# Patient Record
Sex: Male | Born: 1985 | Race: Black or African American | Hispanic: No | Marital: Single | Smoking: Former smoker
Health system: Southern US, Community
[De-identification: ages and names within clinical notes are randomized; demographics above are authoritative.]

---
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
## 2007-02-28 ENCOUNTER — Emergency Department (HOSPITAL_COMMUNITY): Admission: EM | Admit: 2007-02-28 | Discharge: 2007-02-28 | Payer: Self-pay | Admitting: *Deleted

## 2007-03-01 ENCOUNTER — Emergency Department (HOSPITAL_COMMUNITY): Admission: EM | Admit: 2007-03-01 | Discharge: 2007-03-01 | Payer: Self-pay | Admitting: Emergency Medicine

## 2007-03-04 ENCOUNTER — Telehealth: Payer: Self-pay | Admitting: *Deleted

## 2007-09-28 ENCOUNTER — Emergency Department (HOSPITAL_COMMUNITY): Admission: EM | Admit: 2007-09-28 | Discharge: 2007-09-29 | Payer: Self-pay | Admitting: Emergency Medicine

## 2007-12-27 ENCOUNTER — Ambulatory Visit: Payer: Self-pay | Admitting: Family Medicine

## 2009-02-18 ENCOUNTER — Emergency Department (HOSPITAL_COMMUNITY): Admission: EM | Admit: 2009-02-18 | Discharge: 2009-02-18 | Payer: Self-pay | Admitting: Emergency Medicine

## 2009-02-25 ENCOUNTER — Emergency Department (HOSPITAL_COMMUNITY): Admission: EM | Admit: 2009-02-25 | Discharge: 2009-02-25 | Payer: Self-pay | Admitting: Emergency Medicine

## 2009-03-11 ENCOUNTER — Encounter (INDEPENDENT_AMBULATORY_CARE_PROVIDER_SITE_OTHER): Payer: Self-pay | Admitting: *Deleted

## 2009-03-11 DIAGNOSIS — F17200 Nicotine dependence, unspecified, uncomplicated: Secondary | ICD-10-CM

## 2009-04-23 ENCOUNTER — Emergency Department (HOSPITAL_COMMUNITY): Admission: EM | Admit: 2009-04-23 | Discharge: 2009-04-23 | Payer: Self-pay | Admitting: Family Medicine

## 2010-04-10 NOTE — Miscellaneous (Signed)
Summary: Tobacco Larry Kline  Clinical Lists Changes  Problems: Added new problem of TOBACCO Larry Kline (ICD-305.1) 

## 2010-10-14 ENCOUNTER — Inpatient Hospital Stay (INDEPENDENT_AMBULATORY_CARE_PROVIDER_SITE_OTHER)
Admission: RE | Admit: 2010-10-14 | Discharge: 2010-10-14 | Disposition: A | Discharge status: Home / Self Care | Payer: Self-pay | Source: Ambulatory Visit | Attending: Family Medicine | Admitting: Family Medicine

## 2010-10-14 DIAGNOSIS — G44209 Tension-type headache, unspecified, not intractable: Secondary | ICD-10-CM

## 2011-06-20 ENCOUNTER — Encounter (HOSPITAL_COMMUNITY): Payer: Self-pay | Admitting: Emergency Medicine

## 2011-06-20 ENCOUNTER — Emergency Department (HOSPITAL_COMMUNITY)
Admission: EM | Admit: 2011-06-20 | Discharge: 2011-06-20 | Disposition: A | Discharge status: Home / Self Care | Payer: Medicaid Other | Source: Home / Self Care | Attending: Family Medicine | Admitting: Family Medicine

## 2011-06-20 DIAGNOSIS — J029 Acute pharyngitis, unspecified: Secondary | ICD-10-CM

## 2011-06-20 MED ORDER — LIDOCAINE VISCOUS 2 % MT SOLN: 20.0000 mL | OROMUCOSAL | Status: AC | PRN

## 2011-06-20 MED ORDER — CEFDINIR 300 MG PO CAPS: 300.0000 mg | ORAL_CAPSULE | Freq: Two times a day (BID) | ORAL | Status: AC

## 2011-06-20 NOTE — ED Notes (Signed)
Given warm blankets 

## 2011-06-20 NOTE — ED Notes (Signed)
C/o sore throat, neck soreness , particularly on right side of neck.  C/o headache, diarrhea (one stool so far today), no appetite, denies vomiting.

## 2011-06-20 NOTE — Discharge Instructions (Signed)
Your lab results were negative for strep throat. Both viruses and bacteria (like strep) can cause your symptoms, and can appear very similarly. This is likely viral pharyngitis, given a negative strep screen, and 2/4 Centor criteria. Use the viscous lidocaine for symptom relief; DO NOT SWALLOW. Continue supportive care with ibuprofen (Motrin, Advil) and/or acetaminophen (Tylenol) for fever control and control of pain and inflammation. You may alternate these every 4 hours; for example, if you take acetaminophen at noon, then you can take ibuprofen at 4 pm, then acetaminophen again at 8 pm, etc. Also, encourage aggressive hydration. Return to care should your symptoms not improve, or worsen in any way.

## 2011-06-20 NOTE — ED Provider Notes (Signed)
History     CSN: [id number]  Arrival date & time 06/20/11  1017   First MD Initiated Contact with Patient 06/20/11 1038      Chief Complaint  Patient presents with  . Sore Throat    (Consider location/radiation/quality/duration/timing/severity/associated sxs/prior treatment) HPI Comments: Larry Kline presents for evaluation of sore throat, neck tenderness, intermittent chills, and intermittent diarrhea. He reports onset of symptoms 3 days ago after working out at [employer] and sitting in the sauna.he denies any cough, nasal congestion, rhinorrhea. He describes the diarrhea as loose and watery. But has only had one or 2 episodes daily. He denies any abdominal pain, no nausea, and no vomiting.   Patient is a 26 y.o. male presenting with pharyngitis. The history is provided by the patient.  Sore Throat This is a new problem. The problem occurs constantly. The problem has not changed since onset.The symptoms are aggravated by swallowing, eating and drinking. He has tried acetaminophen for the symptoms.    History reviewed. No pertinent past medical history.  History reviewed. No pertinent past surgical history.  History reviewed. No pertinent family history.  History  Substance Use Topics  . Smoking status: Current Everyday Smoker  . Smokeless tobacco: Not on file  . Alcohol Use: Yes      Review of Systems  Constitutional: Positive for chills.  HENT: Positive for congestion, sore throat and rhinorrhea. Negative for trouble swallowing.   Eyes: Negative.   Respiratory: Negative.  Negative for cough.   Cardiovascular: Negative.   Gastrointestinal: Negative.   Genitourinary: Negative.   Musculoskeletal: Negative.   Skin: Negative.   Neurological: Negative.     Allergies  Review of patient's allergies indicates no known allergies.  Home Medications   Current Outpatient Rx  Name Route Sig Dispense Refill  . IBUPROFEN 200 MG PO TABS Oral Take 200 mg by mouth every 6 (six)  hours as needed.    Marland Kitchen OVER THE COUNTER MEDICATION  Over the counter pain medicine    . CEFDINIR 300 MG PO CAPS Oral Take 1 capsule (300 mg total) by mouth 2 (two) times daily. 20 capsule 0  . LIDOCAINE VISCOUS 2 % MT SOLN Oral Take 20 mLs by mouth as needed for pain. Gargle with 15 to 20 ml every 3 to 4 hours as needed for pain; do not swallow 100 mL 0    BP 107/74  Pulse 60  Temp(Src) 98.7 F (37.1 C) (Oral)  Resp 16  SpO2 98%  Physical Exam  Nursing note and vitals reviewed. Constitutional: He is oriented to person, place, and time. He appears well-developed and well-nourished.  HENT:  Head: Normocephalic and atraumatic.  Right Ear: Tympanic membrane normal.  Left Ear: Tympanic membrane normal.  Mouth/Throat: Uvula is midline, oropharynx is clear and moist and mucous membranes are normal.  Eyes: Conjunctivae, EOM and lids are normal. Pupils are equal, round, and reactive to light.  Neck: Normal range of motion. Neck supple.  Cardiovascular: Normal rate, regular rhythm, S1 normal, S2 normal and normal heart sounds.   No murmur heard. Pulmonary/Chest: Effort normal and breath sounds normal. He has no decreased breath sounds. He has no wheezes. He has no rhonchi.  Musculoskeletal: Normal range of motion.  Neurological: He is alert and oriented to person, place, and time.  Skin: Skin is warm and dry.  Psychiatric: His behavior is normal.    ED Course  Procedures (including critical care time)   Labs Reviewed  POCT RAPID STREP A ([facility] URG  CARE ONLY)   No results found.   1. Pharyngitis       MDM  RST negative; 2/4 Centor criteria; likely viral; given delayed rx for cefdinir, viscous lidocaine        Renaee Munda, MD 06/20/11 1421

## 2013-05-28 ENCOUNTER — Emergency Department (HOSPITAL_COMMUNITY)
Admission: EM | Admit: 2013-05-28 | Discharge: 2013-05-29 | Disposition: A | Discharge status: Home / Self Care | Payer: Medicaid Other | Attending: Emergency Medicine | Admitting: Emergency Medicine

## 2013-05-28 ENCOUNTER — Encounter (HOSPITAL_COMMUNITY): Payer: Self-pay | Admitting: Emergency Medicine

## 2013-05-28 DIAGNOSIS — F17200 Nicotine dependence, unspecified, uncomplicated: Secondary | ICD-10-CM | POA: Insufficient documentation

## 2013-05-28 DIAGNOSIS — K029 Dental caries, unspecified: Secondary | ICD-10-CM | POA: Insufficient documentation

## 2013-05-28 DIAGNOSIS — K0889 Other specified disorders of teeth and supporting structures: Secondary | ICD-10-CM

## 2013-05-28 DIAGNOSIS — K089 Disorder of teeth and supporting structures, unspecified: Secondary | ICD-10-CM | POA: Insufficient documentation

## 2013-05-28 MED ORDER — PENICILLIN V POTASSIUM 500 MG PO TABS
500.0000 mg | ORAL_TABLET | Freq: Four times a day (QID) | ORAL | Status: AC
Start: 2013-05-28 — End: 2013-06-04

## 2013-05-28 MED ORDER — HYDROCODONE-ACETAMINOPHEN 5-325 MG PO TABS
2.0000 | ORAL_TABLET | Freq: Once | ORAL | Status: AC
  Administered 2013-05-29: 2 via ORAL
  Filled 2013-05-28: qty 2

## 2013-05-28 MED ORDER — IBUPROFEN 800 MG PO TABS
800.0000 mg | ORAL_TABLET | Freq: Three times a day (TID) | ORAL | Status: DC
Start: 2013-05-28 — End: 2014-05-03

## 2013-05-28 MED ORDER — HYDROCODONE-ACETAMINOPHEN 5-325 MG PO TABS: 1.0000 | ORAL_TABLET | ORAL | Status: DC | PRN

## 2013-05-28 NOTE — Discharge Instructions (Signed)
Take Ibuprofen for pain and swelling  Take Vicodin for severe pain - Please be careful with this medication.  It can cause drowsiness.  Use caution while driving, operating machinery, drinking alcohol, or any other activities that may impair your physical or mental abilities.   Return to the emergency department if you develop any changing/worsening condition, repeated vomiting, fever, or any other concerns (please read additional information regarding your condition below)   Dental Pain A tooth ache may be caused by cavities (tooth decay). Cavities expose the nerve of the tooth to air and hot or cold temperatures. It may come from an infection or abscess (also called a boil or furuncle) around your tooth. It is also often caused by dental caries (tooth decay). This causes the pain you are having. DIAGNOSIS  Your caregiver can diagnose this problem by exam. TREATMENT   If caused by an infection, it may be treated with medications which kill germs (antibiotics) and pain medications as prescribed by your caregiver. Take medications as directed.  Only take over-the-counter or prescription medicines for pain, discomfort, or fever as directed by your caregiver.  Whether the tooth ache today is caused by infection or dental disease, you should see your dentist as soon as possible for further care. SEEK MEDICAL CARE IF: The exam and treatment you received today has been provided on an emergency basis only. This is not a substitute for complete medical or dental care. If your problem worsens or new problems (symptoms) appear, and you are unable to meet with your dentist, call or return to this location. SEEK IMMEDIATE MEDICAL CARE IF:   You have a fever.  You develop redness and swelling of your face, jaw, or neck.  You are unable to open your mouth.  You have severe pain uncontrolled by pain medicine. MAKE SURE YOU:   Understand these instructions.  Will watch your condition.  Will get help  right away if you are not doing well or get worse. Document Released: 02/23/2005 Document Revised: 05/18/2011 Document Reviewed: 10/12/2007 [facility] Patient Information 2014 [place], [place].    Emergency Department Resource Guide 1) Find a Doctor and Pay Out of Pocket Although you won't have to find out who is covered by your insurance plan, it is a good idea to ask around and get recommendations. You will then need to call the office and see if the doctor you have chosen will accept you as a new patient and what types of options they offer for patients who are self-pay. Some doctors offer discounts or will set up payment plans for their patients who do not have insurance, but you will need to ask so you aren't surprised when you get to your appointment.  2) Contact Your Local Health Department Not all health departments have doctors that can see patients for sick visits, but many do, so it is worth a call to see if yours does. If you don't know where your local health department is, you can check in your phone book. The CDC also has a tool to help you locate your state's health department, and many state websites also have listings of all of their local health departments.  3) Find a Walk-in Clinic If your illness is not likely to be very severe or complicated, you may want to try a walk in clinic. These are popping up all over the country in pharmacies, drugstores, and shopping centers. They're usually staffed by nurse practitioners or physician assistants that have been trained to treat  common illnesses and complaints. They're usually fairly quick and inexpensive. However, if you have serious medical issues or chronic medical problems, these are probably not your best option.  No Primary Care Doctor: - Call Health Connect at  [phone] - they can help you locate a primary care doctor that  accepts your insurance, provides certain services, etc. - Physician Referral Service-  [phone]  Chronic Pain Problems: Organization         Address  Phone   Notes  [facility]  [phone]) [phone] Patients need to be referred by their primary care doctor.   Medication Assistance: Organization         Address  Phone   Notes  [facility] Medication [facility] [address] [place], [place] [address] [phone]) [phone] --Must be a resident of [facility] -- Must have NO insurance coverage whatsoever (no Medicaid/ Medicare, etc.) -- The pt. MUST have a primary care doctor that directs their care regularly and follows them in the community   MedAssist  [phone]) [phone]   [employer]  [phone]    Agencies that provide inexpensive medical care: Organization         Address  Phone   Notes  [facility]  [phone](336) [phone]   [facility]    [phone]) [phone]   [facility] [address] [place], [place] [address] [phone]) [phone]   [facility] [address], [place] [phone]) [phone]   [facility]anned Parenthood    [phone](336) [phone]   [facility]    [phone](336) [phone]   [facility]  [address], [place] Phone:  [phone](336) [phone], Fax:  [phone] Hours of Operation:  9 am - 6 pm, M-F.  Also accepts Medicaid/Medicare and self-pay.  [facility]  [address], [place] Phone: [phone]) [phone], Fax: [phone](336) [phone]. Hours of Operation:  8:30 am - 5:30 pm, M-F.  Also accepts Medicaid and self-pay.  [facility] [place] [address], [place] Phone: [phone](336) [phone]   [facility] [address], [place], [place] [phone], Ext. [phone] Mondays & Thursdays: 7-9 AM.  First 15 patients are seen on a first come, first serve basis.    Medicaid-accepting [facility] Providers:  Organization         Address  Phone   Notes  [facility] [address],  [place] [phone] Also accepts self-pay patients.  [facility] [address], [place]  [phone]) [phone]   [facility] [address], [place] [phone]) [phone]   [facility] [address] [place] [address], [place] [phone](336) [phone]   Renaye RakersVeita Bland [address], [place]   [phone] Only accepts [place] Access [place] patients after they have their name applied to their card.   Self-Pay (no insurance) in [facility] [place]:  Organization         Address  Phone   Notes  Sickle Cell Patients, [facility] [address], [place] [phone]   [facility] [address], [place] [phone]) [phone]   [facility] [place]  [address], [place] [phone]) [phone]   [facility]/Dr. Osei-Bonsu  [address] [place] [address], [place] or [phone], [address], [place] [phone] Phone number for both [facility] and [place] locations is the same.  [facility] [address], [place] [phone]   [facility] [address], [place] or [address] [phone]   [facility] [address] [place] [phone], phone; [phone], fax Sees patients 1st and 3rd Saturday of every month.  Must not qualify for public or private insurance (i.e. Medicaid, Medicare, [place] Health Choice, Veterans' Benefits)  Household income should be no more than 200% of the poverty level The clinic cannot treat you if you are pregnant or think you are pregnant  Sexually transmitted diseases are not treated at the clinic.    Dental Care: Organization         Address  Phone  Notes  [facility] Department of [facility] [address] [place], [place] [phone] Accepts children up to age 84 who are enrolled in  [place] or [place] Health Choice; pregnant women with a Medicaid card; and children who have applied for Medicaid or [place] Health Choice, but were declined, whose parents can pay a reduced fee at time of service.  [facility] Department of [facility]  [address], [place] [phone] Accepts children up to age 75 who are enrolled in [place] or [place] Health Choice; pregnant women with a Medicaid card; and children who have applied for Medicaid or  Health Choice, but were declined, whose parents can pay a reduced fee at time of service.  [facility]  [address] [place], [place] [phone] Patients are seen by appointment only. Walk-ins are not accepted. [facility] will see patients 51 years of age and older. Monday - Tuesday (8am-5pm) Most Wednesdays (8:30-5pm) $30 per visit, cash only  [facility] Adult Dental Access PROGRAM  [address], [facility] [phone] Patients are seen by appointment only. Walk-ins are not accepted. [facility] will see patients 16 years of age and older. One Wednesday Evening (Monthly: Volunteer Based).  $30 per visit, cash only  [employer]  [phone] for adults; Children under age 81, call Graduate Pediatric Dentistry at [phone]. Children aged 5-14, please call [phone] to request a pediatric application.  Dental services are provided in all areas of dental care including fillings, crowns and bridges, complete and partial dentures, implants, gum treatment, root canals, and extractions. Preventive care is also provided. Treatment is provided to both adults and children. Patients are selected via a lottery and there is often a waiting list.   [facility] [address], [place]  [phone] [email or web address]   [facility] [address] [place], [place] [phone], Ext. [phone] Second and Fourth Thursday of each month, opens at 6:30  AM; Clinic ends at 9 AM.  Patients are seen on a first-come first-served basis, and a limited number are seen during each clinic.   [facility]  [address] [place], [place] [phone]   Eligibility Requirements You must have lived in [place], [place], or [place] counties for at least the last three months.   You cannot be eligible for state or federal sponsored [place], including [employer], [place], or [employer].   You generally cannot be eligible for healthcare insurance through your employer.    How to apply: Eligibility screenings are held every Tuesday  and Wednesday afternoon from 1:00 pm until 4:00 pm. You do not need an appointment for the interview!  [facility] [address] [place], [place] [phone]   [facility] Health Department  [phone]   [facility] Health Department  [phone]   [facility] Health Department  [phone]    Behavioral Health Resources in the Community: Intensive Outpatient Programs Organization         Address  Phone  Notes  [facility] [address] [place], [place] [phone]   [facility] Outpatient [address], [place], [place] [phone]   [facility] [address] [place], [place]  [phone]   [facility] Mental Health [address],  [place], [place] [phone] or [phone]   Substance Abuse Resources Organization         Address  Phone  Notes  Alcohol and Drug Services  [phone]   [facility]  [phone]   The [place]  [phone]   Floydene Flock  [phone]   Residential & Outpatient Substance Abuse Program  [phone]   Psychological Services Organization         Address  Phone  Notes  [facility] Behavior[facility] Health  [phone]   [facility]  [phone]   [facility] Mental Health [address] [place] [phone] or  [phone]    Mobile Crisis Teams Organization         Address  Phone  Notes  [facility], Mobile Crisis Care Unit  [phone]   [facility]  [address] [place], [place] [phone]   Doristine Locks [address] [place] [phone]    Self-Help/Support Groups Organization         Address  Phone             Notes  [facility] [place] - variety of support groups  [phone]- I7437963 Call for more information  Narcotics Anonymous (NA), [facility]aring [facility] [address], [employer] [place]  2 meetings at this location   Statistician         Address  Phone  Notes  [facility] [address],    [place]  [phone]   [facility]  [address], [place] [address], [place], [place] [phone]   [facility] [address] [place], [place] [phone] Admissions: 8am-3pm M-F  [facility] [address],    [place], [place] [phone]   [facility] [address] [place], [place], [place] [phone]   [facility] [address]  [place], [place] [phone]   [facility] - Intensive Outpatient [address], [place], [place] [phone]   [facility] ([facility] [address] [place].,  [place], [place] [phone] or [phone]   [facility] (RTS) [address], [place], [place] [phone] Accepts Medicaid  [facility] [place] [address]  [place] [phone] Substance Abuse/Addiction Treatment   [facility] Organization         Address  Phone  Notes  [facility]  [phone]   Angie Fava, PhD [address] [place], [place]   [phone] or [phone]   [facility] Behavioral   [address] [place], [place] [phone]   [facility] [address],  [place], [place] [phone] Insurance/Medicaid/sponsorship through [facility]  [facility]ith [facility] Families [address] [place], [place] [phone] [place], [place] [phone]    Dr. Adele Schilder  [phone]   [facility] Dept. 1) [address], [place] 2) [place] 3)  [place] [address], [place] [phone]  [phone]   [place] [phone] or [phone] (After Hours)

## 2013-05-28 NOTE — ED Provider Notes (Signed)
CSN: [id number]     Arrival date & time 05/28/13  2207 History  This chart was scribed for non-physician practitioner, Coral CeoJessica Anneth Brunell, PA-C, working with Flint MelterElliott L Wentz, MD by Smiley HousemanFallon Davis, ED Scribe. This patient was seen in room TR05C/TR05C and the patient's care was started at 11:48 PM.  Chief Complaint  Patient presents with  . Dental Pain   The history is provided by the patient. No language interpreter was used.   HPI Comments: Larry LevinsWilliam Kline is a 28 y.o. male with no PMH who presents to the Emergency Department complaining of constant worsening throbbing right lower, left upper and lower dental pain that started about 2 weeks ago. Denies trauma/injuries. He denies otalgia, fever, chills, neck swelling, difficulty breathing or swallowing, nausea and vomiting.  Pt states he has tried ibuprofen, Advil, and other OTC medications without relief. Pt denies visiting a dentist for this complaint.  Pt is a current smoker.  He denies any medical allergies.     History reviewed. No pertinent past medical history. History reviewed. No pertinent past surgical history. No family history on file. History  Substance Use Topics  . Smoking status: Current Every Day Smoker  . Smokeless tobacco: Not on file  . Alcohol Use: Yes    Review of Systems  Constitutional: Negative for fever and chills.  HENT: Positive for dental problem. Negative for ear pain and facial swelling.   Respiratory: Negative for chest tightness and shortness of breath.   Gastrointestinal: Negative for nausea, vomiting and abdominal pain.  Skin: Negative for color change and rash.  Neurological: Negative for headaches.  Psychiatric/Behavioral: Negative for behavioral problems and confusion.  All other systems reviewed and are negative.   Allergies  Review of patient's allergies indicates no known allergies.  Home Medications   Current Outpatient Rx  Name  Route  Sig  Dispense  Refill  . ibuprofen (ADVIL,MOTRIN) 200 MG  tablet   Oral   Take 800 mg by mouth every 6 (six) hours as needed for mild pain.           Triage Vitals: BP 149/98  Pulse 66  Temp(Src) 98.8 F (37.1 C) (Oral)  Resp 20  Wt 281 lb (127.461 kg)  SpO2 99%  Filed Vitals:   05/28/13 2211 05/28/13 2322  BP: 155/89 149/98  Pulse: 91 66  Temp: 98.2 F (36.8 C) 98.8 F (37.1 C)  TempSrc: Oral Oral  Resp: 16 20  Weight: 281 lb (127.461 kg)   SpO2: 100% 99%    Physical Exam  Nursing note and vitals reviewed. Constitutional: He is oriented to person, place, and time. He appears well-developed and well-nourished. No distress.  HENT:  Head: Normocephalic and atraumatic.  Right Ear: External ear normal.  Left Ear: External ear normal.  Nose: Nose normal.  Mouth/Throat: Oropharynx is clear and moist.    Evidence of dental caries throughout. No evidence of a dental abscess or gingival edema. Patient reports pain to the 3rd molars of the left and right lower and left upper dentition. No trismus. No difficulty controlling secretions. No palpable masses to the face throughout. Tympanic membranes gray and translucent bilaterally with no erythema, edema, or hemotympanum.  No mastoid or tragal tenderness bilaterally.   Eyes: Conjunctivae and EOM are normal. Pupils are equal, round, and reactive to light.  Neck: Normal range of motion. Neck supple. No tracheal deviation present.  No cervical lymphadenopathy. No nuchal rigidity. No palpable masses or edema. No submental fullness.   Cardiovascular: Normal rate,  regular rhythm and normal heart sounds.  Exam reveals no gallop and no friction rub.   No murmur heard. Pulmonary/Chest: Effort normal and breath sounds normal. No respiratory distress. He has no wheezes. He has no rales. He exhibits no tenderness.  Abdominal: Soft. He exhibits no distension. There is no tenderness.  Musculoskeletal: Normal range of motion. He exhibits no edema and no tenderness.  Neurological: He is alert and  oriented to person, place, and time.  Skin: Skin is warm and dry.  Psychiatric: He has a normal mood and affect. His behavior is normal.    ED Course  Procedures (including critical care time) DIAGNOSTIC STUDIES: Oxygen Saturation is 99% on PA, normal by my interpretation.    COORDINATION OF CARE: 11:52 PM-Will discharge with Vicodin, ibuprofen, and penicillin.  Informed pt to follow up with a dentist.  Patient informed of current plan of treatment and evaluation and agrees with plan.    MDM   Larry Kline is a 28 y.o. male with no PMH who presents to the Emergency Department complaining of constant worsening throbbing right lower, left upper and lower dental pain that started about 2 weeks ago. Etiology of dental pain likely due to dental caries. No evidence of a dental abscess at this time. No concerning signs/symptoms for Ludwig's Angina at this time.  Patient afebrile and non-toxic in appearance.  Patient instructed to follow-up with a dentist for further evaluation and management.  Resources provided.  Return precautions were given.  Patient in agreement with discharge and plan.     Discharge Medication List as of 05/28/2013 11:57 PM    START taking these medications   Details  HYDROcodone-acetaminophen (NORCO/VICODIN) 5-325 MG per tablet Take 1 tablet by mouth every 4 (four) hours as needed., Starting 05/28/2013, Until Discontinued, Print    !! ibuprofen (ADVIL,MOTRIN) 800 MG tablet Take 1 tablet (800 mg total) by mouth 3 (three) times daily., Starting 05/28/2013, Until Discontinued, Print    penicillin v potassium (VEETID) 500 MG tablet Take 1 tablet (500 mg total) by mouth 4 (four) times daily., Starting 05/28/2013, Last dose on Sun 06/04/13, Print     !! - Potential duplicate medications found. Please discuss with provider.      Final impressions: 1. Pain, dental      Larry Kline   I personally performed the services described in this documentation, which  was scribed in my presence. The recorded information has been reviewed and is accurate.       Jillyn Ledger, PA-C 05/29/13 1314

## 2013-05-28 NOTE — ED Notes (Signed)
C/o R lower dental pain x 2 weeks.Taking pain medication without relief.

## 2013-05-28 NOTE — ED Notes (Signed)
Holding right side of face when I walked into room.  C/O pain to right lower jaw.  Caries noted to all lower molars with most posterior tooth broken on inside corner.

## 2013-05-29 NOTE — ED Provider Notes (Signed)
Medical screening examination/treatment/procedure(s) were performed by non-physician practitioner and as supervising physician I was immediately available for consultation/collaboration.   EKG Interpretation None       Flint MelterElliott L Sherard Sutch, MD 05/29/13 2116

## 2014-04-26 ENCOUNTER — Emergency Department (HOSPITAL_COMMUNITY): Payer: No Typology Code available for payment source

## 2014-04-26 ENCOUNTER — Encounter (HOSPITAL_COMMUNITY): Payer: Self-pay

## 2014-04-26 ENCOUNTER — Emergency Department (HOSPITAL_COMMUNITY)
Admission: EM | Admit: 2014-04-26 | Discharge: 2014-04-26 | Disposition: A | Discharge status: Home / Self Care | Payer: No Typology Code available for payment source | Attending: Emergency Medicine | Admitting: Emergency Medicine

## 2014-04-26 DIAGNOSIS — M62838 Other muscle spasm: Secondary | ICD-10-CM

## 2014-04-26 DIAGNOSIS — Y92410 Unspecified street and highway as the place of occurrence of the external cause: Secondary | ICD-10-CM | POA: Insufficient documentation

## 2014-04-26 DIAGNOSIS — S199XXA Unspecified injury of neck, initial encounter: Secondary | ICD-10-CM | POA: Insufficient documentation

## 2014-04-26 DIAGNOSIS — Z720 Tobacco use: Secondary | ICD-10-CM | POA: Diagnosis not present

## 2014-04-26 DIAGNOSIS — Y998 Other external cause status: Secondary | ICD-10-CM | POA: Insufficient documentation

## 2014-04-26 DIAGNOSIS — Y9389 Activity, other specified: Secondary | ICD-10-CM | POA: Diagnosis not present

## 2014-04-26 DIAGNOSIS — M549 Dorsalgia, unspecified: Secondary | ICD-10-CM

## 2014-04-26 DIAGNOSIS — S29092A Other injury of muscle and tendon of back wall of thorax, initial encounter: Secondary | ICD-10-CM | POA: Diagnosis not present

## 2014-04-26 DIAGNOSIS — S4992XA Unspecified injury of left shoulder and upper arm, initial encounter: Secondary | ICD-10-CM | POA: Insufficient documentation

## 2014-04-26 MED ORDER — NAPROXEN 500 MG PO TABS: 500.0000 mg | ORAL_TABLET | Freq: Two times a day (BID) | ORAL | Status: DC

## 2014-04-26 MED ORDER — NAPROXEN 500 MG PO TABS
500.0000 mg | ORAL_TABLET | Freq: Once | ORAL | Status: AC
  Administered 2014-04-26: 500 mg via ORAL
  Filled 2014-04-26: qty 1

## 2014-04-26 MED ORDER — METHOCARBAMOL 500 MG PO TABS: 500.0000 mg | ORAL_TABLET | Freq: Two times a day (BID) | ORAL | Status: DC

## 2014-04-26 MED ORDER — OXYCODONE-ACETAMINOPHEN 5-325 MG PO TABS
1.0000 | ORAL_TABLET | Freq: Once | ORAL | Status: AC
  Administered 2014-04-26: 1 via ORAL
  Filled 2014-04-26: qty 1

## 2014-04-26 MED ORDER — METHOCARBAMOL 500 MG PO TABS
500.0000 mg | ORAL_TABLET | Freq: Once | ORAL | Status: AC
  Administered 2014-04-26: 500 mg via ORAL
  Filled 2014-04-26: qty 1
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
## 2014-04-27 ENCOUNTER — Encounter (HOSPITAL_COMMUNITY): Payer: Self-pay | Admitting: Emergency Medicine

## 2014-04-27 ENCOUNTER — Emergency Department (HOSPITAL_COMMUNITY)
Admission: EM | Admit: 2014-04-27 | Discharge: 2014-04-27 | Disposition: A | Discharge status: Home / Self Care | Payer: No Typology Code available for payment source | Attending: Emergency Medicine | Admitting: Emergency Medicine

## 2014-04-27 DIAGNOSIS — Z720 Tobacco use: Secondary | ICD-10-CM | POA: Insufficient documentation

## 2014-04-27 DIAGNOSIS — M62830 Muscle spasm of back: Secondary | ICD-10-CM

## 2014-04-27 DIAGNOSIS — M545 Low back pain: Secondary | ICD-10-CM | POA: Diagnosis not present

## 2014-04-27 DIAGNOSIS — M62838 Other muscle spasm: Secondary | ICD-10-CM | POA: Insufficient documentation

## 2014-04-27 DIAGNOSIS — Z791 Long term (current) use of non-steroidal anti-inflammatories (NSAID): Secondary | ICD-10-CM | POA: Diagnosis not present

## 2014-04-27 DIAGNOSIS — G8911 Acute pain due to trauma: Secondary | ICD-10-CM | POA: Insufficient documentation

## 2014-04-27 DIAGNOSIS — L83 Acanthosis nigricans: Secondary | ICD-10-CM | POA: Insufficient documentation

## 2014-04-27 DIAGNOSIS — M546 Pain in thoracic spine: Secondary | ICD-10-CM | POA: Diagnosis not present

## 2014-04-27 DIAGNOSIS — Z79899 Other long term (current) drug therapy: Secondary | ICD-10-CM | POA: Insufficient documentation

## 2014-04-27 DIAGNOSIS — M542 Cervicalgia: Secondary | ICD-10-CM | POA: Diagnosis present

## 2014-04-27 MED ORDER — HYDROCODONE-ACETAMINOPHEN 5-325 MG PO TABS: 1.0000 | ORAL_TABLET | ORAL | Status: DC | PRN
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
## 2014-05-03 ENCOUNTER — Emergency Department (HOSPITAL_COMMUNITY)
Admission: EM | Admit: 2014-05-03 | Discharge: 2014-05-03 | Disposition: A | Discharge status: Home / Self Care | Payer: No Typology Code available for payment source | Attending: Emergency Medicine | Admitting: Emergency Medicine

## 2014-05-03 ENCOUNTER — Encounter (HOSPITAL_COMMUNITY): Payer: Self-pay | Admitting: Emergency Medicine

## 2014-05-03 DIAGNOSIS — Y998 Other external cause status: Secondary | ICD-10-CM | POA: Diagnosis not present

## 2014-05-03 DIAGNOSIS — Y92410 Unspecified street and highway as the place of occurrence of the external cause: Secondary | ICD-10-CM | POA: Insufficient documentation

## 2014-05-03 DIAGNOSIS — Z791 Long term (current) use of non-steroidal anti-inflammatories (NSAID): Secondary | ICD-10-CM | POA: Insufficient documentation

## 2014-05-03 DIAGNOSIS — S3992XA Unspecified injury of lower back, initial encounter: Secondary | ICD-10-CM | POA: Insufficient documentation

## 2014-05-03 DIAGNOSIS — Y9389 Activity, other specified: Secondary | ICD-10-CM | POA: Insufficient documentation

## 2014-05-03 DIAGNOSIS — S199XXA Unspecified injury of neck, initial encounter: Secondary | ICD-10-CM | POA: Diagnosis not present

## 2014-05-03 DIAGNOSIS — Z720 Tobacco use: Secondary | ICD-10-CM | POA: Insufficient documentation

## 2014-05-03 MED ORDER — METHOCARBAMOL 500 MG PO TABS: 500.0000 mg | ORAL_TABLET | Freq: Two times a day (BID) | ORAL | Status: DC

## 2014-05-03 MED ORDER — IBUPROFEN 800 MG PO TABS: 800.0000 mg | ORAL_TABLET | Freq: Three times a day (TID) | ORAL | Status: DC
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
## 2014-05-03 NOTE — Discharge Instructions (Signed)
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
## 2014-07-21 ENCOUNTER — Encounter (HOSPITAL_COMMUNITY): Payer: Self-pay | Admitting: Emergency Medicine

## 2014-07-21 ENCOUNTER — Emergency Department (INDEPENDENT_AMBULATORY_CARE_PROVIDER_SITE_OTHER)
Admission: EM | Admit: 2014-07-21 | Discharge: 2014-07-21 | Disposition: A | Discharge status: Home / Self Care | Payer: Self-pay | Source: Home / Self Care | Attending: Family Medicine | Admitting: Family Medicine

## 2014-07-21 DIAGNOSIS — S0592XA Unspecified injury of left eye and orbit, initial encounter: Secondary | ICD-10-CM

## 2014-07-21 DIAGNOSIS — H109 Unspecified conjunctivitis: Secondary | ICD-10-CM

## 2014-07-21 MED ORDER — KETOTIFEN FUMARATE 0.025 % OP SOLN: 1.0000 [drp] | Freq: Two times a day (BID) | OPHTHALMIC | Status: DC

## 2014-07-21 MED ORDER — TETRACAINE HCL 0.5 % OP SOLN
OPHTHALMIC | Status: AC
  Filled 2014-07-21: qty 2

## 2014-07-21 NOTE — ED Notes (Signed)
Reports swelling of left eye onset 1300 today while at work; BP was taken = 152/90 Also reports mild HA Alert, no signs of acute distress.

## 2014-07-21 NOTE — Discharge Instructions (Signed)
There is no evidence of a scratched her eye called a corneal abrasion. Something hit her eye causing mild trauma. Please use the eyedrops for redness and irritation. Should get better over the next 1-2 days. You may use additional lubricating eyedrops as needed for additional relief. Her blood pressure is completely normal and you don't not need any further intervention or workup for this.

## 2014-07-21 NOTE — ED Provider Notes (Signed)
CSN: [id number]     Arrival date & time 07/21/14  1357 History   First MD Initiated Contact with Patient 07/21/14 1519     Chief Complaint  Patient presents with  . Eye Problem   (Consider location/radiation/quality/duration/timing/severity/associated sxs/prior Treatment) HPI  L eye irritation. Something flew in the eye today at work. Watery and red since onset. Constant. No change in vision, feels like something is stuck in his eye. Patient went to the nurse at work who took his blood pressure is 152/90 that he needed immediate medical attention.   Denies headache, fever, discharge, shortness of breath, chest pain, palpitations, neck stiffness, nausea, vomiting, diarrhea, constipation, back pain.    Marland Kitchen.  History reviewed. No pertinent past medical history. History reviewed. No pertinent past surgical history. Family History  Problem Relation Age of Onset  . Family history unknown: Yes   History  Substance Use Topics  . Smoking status: Current Every Day Smoker -- 0.33 packs/day  . Smokeless tobacco: Not on file  . Alcohol Use: Yes    Review of Systems Per HPI with all other pertinent systems negative.    Allergies  Review of patient's allergies indicates no known allergies.  Home Medications   Prior to Admission medications   Medication Sig Start Date End Date Taking? Authorizing Provider  ketotifen (ZADITOR) 0.025 % ophthalmic solution Place 1-2 drops into the left eye 2 (two) times daily. 07/21/14   Ozella Rocksavid J Mabry Tift, MD   BP 129/80 mmHg  Pulse 71  Temp(Src) 97.7 F (36.5 C) (Oral)  Resp 16  SpO2 97% Physical Exam Physical Exam  Constitutional: oriented to person, place, and time. appears well-developed and well-nourished. No distress.  HENT:  Head: Normocephalic and atraumatic.  Eyes: EOMI. PERRL.  Fluoroscopic exam the left eye is completely normal. No evidence of abrasion. Neck: Normal range of motion.  Cardiovascular: RRR, no m/r/g, 2+ distal pulses,   Pulmonary/Chest: Effort normal and breath sounds normal. No respiratory distress.  Abdominal: Soft. Bowel sounds are normal. NonTTP, no distension.  Musculoskeletal: Normal range of motion. Non ttp, no effusion.  Neurological: alert and oriented to person, place, and time.  Skin: Skin is warm. No rash noted. non diaphoretic.  Psychiatric: normal mood and affect. behavior is normal. Judgment and thought content normal.   ED Course  Procedures (including critical care time) Labs Review Labs Reviewed - No data to display  Imaging Review No results found.   MDM   1. Conjunctivitis of left eye   2. Eye trauma, left, initial encounter    Something likely struck the patient's eye causing it to turn red and become watery. There is no evidence of corneal abrasion. Foreign body in the eye. Patient to start using Zaditor drops for red relief. And may use lubricating drops at other times during the day. Patient to follow-up with his primary care physician and emergency room if develops further worsening symptoms. Of note patient's blood pressure is normal her patient elevated blood pressures likely due to acute trauma to his eye at the time of the incident.    Ozella Rocksavid J Zalmen Wrightsman, MD 07/21/14 1537

## 2014-09-09 ENCOUNTER — Emergency Department (HOSPITAL_COMMUNITY)
Admission: EM | Admit: 2014-09-09 | Discharge: 2014-09-09 | Disposition: A | Discharge status: Home / Self Care | Payer: Medicaid Other | Attending: Emergency Medicine | Admitting: Emergency Medicine

## 2014-09-09 ENCOUNTER — Encounter (HOSPITAL_COMMUNITY): Payer: Self-pay | Admitting: Emergency Medicine

## 2014-09-09 DIAGNOSIS — Z79899 Other long term (current) drug therapy: Secondary | ICD-10-CM | POA: Insufficient documentation

## 2014-09-09 DIAGNOSIS — K029 Dental caries, unspecified: Secondary | ICD-10-CM | POA: Insufficient documentation

## 2014-09-09 DIAGNOSIS — K0889 Other specified disorders of teeth and supporting structures: Secondary | ICD-10-CM

## 2014-09-09 DIAGNOSIS — Z720 Tobacco use: Secondary | ICD-10-CM | POA: Insufficient documentation

## 2014-09-09 DIAGNOSIS — K088 Other specified disorders of teeth and supporting structures: Secondary | ICD-10-CM | POA: Insufficient documentation

## 2014-09-09 MED ORDER — OXYCODONE-ACETAMINOPHEN 5-325 MG PO TABS
2.0000 | ORAL_TABLET | Freq: Once | ORAL | Status: AC
  Administered 2014-09-09: 2 via ORAL
  Filled 2014-09-09: qty 2

## 2014-09-09 MED ORDER — HYDROCODONE-ACETAMINOPHEN 5-325 MG PO TABS: 2.0000 | ORAL_TABLET | ORAL | Status: DC | PRN

## 2014-09-09 MED ORDER — IBUPROFEN 400 MG PO TABS: 800.0000 mg | ORAL_TABLET | Freq: Once | ORAL | Status: DC

## 2014-09-09 MED ORDER — HYDROCODONE-ACETAMINOPHEN 5-325 MG PO TABS: 2.0000 | ORAL_TABLET | Freq: Once | ORAL | Status: DC

## 2014-09-09 MED ORDER — IBUPROFEN 800 MG PO TABS: 800.0000 mg | ORAL_TABLET | Freq: Three times a day (TID) | ORAL | Status: DC

## 2014-09-09 NOTE — ED Provider Notes (Signed)
CSN: [id number]     Arrival date & time 09/09/14  1922 History  This chart was scribed for non-physician practitioner, Danelle BerryLeisa Arina Torry, PA-C, working with Tilden FossaElizabeth Rees, MD, by Ronney LionSuzanne Le, ED Scribe. This patient was seen in room TR04C/TR04C and the patient's care was started at 9:49 PM.    Chief Complaint  Patient presents with  . Dental Pain   The history is provided by the patient. No language interpreter was used.    HPI Comments: Larry Kline is a 29 y.o. male who presents to the Emergency Department complaining of constant, severe left lower dental pain that began several months ago and has been worsening since. Biting down or chewing exacerbates the pain, and patient states he has difficulty eating due to his discomfort. Patient adds that he tried taking "10 Benadryl pills" with no relief. He denies trismus, fever, chills, sweats, nausea, vomiting, swelling, redness, difficulty speaking/swallowing, and HA.   History reviewed. No pertinent past medical history. History reviewed. No pertinent past surgical history. Family History  Problem Relation Age of Onset  . Family history unknown: Yes   History  Substance Use Topics  . Smoking status: Current Every Day Smoker -- 0.33 packs/day  . Smokeless tobacco: Not on file  . Alcohol Use: Yes    Review of Systems  Constitutional: Negative for fever and diaphoresis.  HENT: Positive for dental problem.   Gastrointestinal: Negative for nausea and vomiting.    Allergies  Review of patient's allergies indicates no known allergies.  Home Medications   Prior to Admission medications   Medication Sig Start Date End Date Taking? Authorizing Provider  ketotifen (ZADITOR) 0.025 % ophthalmic solution Place 1-2 drops into the left eye 2 (two) times daily. 07/21/14   Ozella Rocksavid J Merrell, MD   BP 158/86 mmHg  Pulse 77  Resp 16  Ht 5\' 11"  (1.803 m)  Wt 292 lb (132.45 kg)  BMI 40.74 kg/m2  SpO2 100% Physical Exam  Constitutional: He is oriented  to person, place, and time. He appears well-developed and well-nourished. No distress.  HENT:  Head: Normocephalic and atraumatic.  Right Ear: External ear normal.  Left Ear: External ear normal.  Nose: Nose normal.  Mouth/Throat: Oropharynx is clear and moist and mucous membranes are normal. Mucous membranes are not pale, not dry and not cyanotic. No oral lesions. No trismus in the jaw. Abnormal dentition. Dental caries present. No dental abscesses, uvula swelling or lacerations. No oropharyngeal exudate, posterior oropharyngeal edema, posterior oropharyngeal erythema or tonsillar abscesses.    Partially avulsed molar. No erythema, swelling, fluctuance, or tenderness of the gums. No sublingual tenderness. Multiple dental caries. Normal oral mucosa. Normal bite strength  Eyes: Conjunctivae and EOM are normal. Pupils are equal, round, and reactive to light. Right eye exhibits no discharge. Left eye exhibits no discharge. No scleral icterus.  No conjunctival pallor.  Neck: Normal range of motion. Neck supple. No JVD present. No tracheal deviation present.  Cardiovascular: Normal rate.   Pulmonary/Chest: Effort normal and breath sounds normal. No stridor. No respiratory distress.  Musculoskeletal: Normal range of motion. He exhibits no edema or tenderness.  Lymphadenopathy:    He has no cervical adenopathy.  Neurological: He is alert and oriented to person, place, and time.  Skin: Skin is warm and dry. No rash noted. He is not diaphoretic. No erythema. No pallor.  Psychiatric: He has a normal mood and affect. His behavior is normal. Judgment and thought content normal.  Nursing note and vitals reviewed.  ED Course  Procedures (including critical care time)  DIAGNOSTIC STUDIES: Oxygen Saturation is 100% on RA, normal by my interpretation.    COORDINATION OF CARE: 9:49 PM - Pt reports significantly reduced pain after being given Percocet. Discussed treatment plan with pt at bedside which  includes f/u with dentist and pain medications, and pt agreed to plan. Advised pt to stop taking 10 antihistamine pills.    MDM   Final diagnoses:  None   Patient with toothache.  No gross abscess.  Exam unconcerning for Ludwig's angina or spread of infection.  Will treat with pain medicine.  Urged patient to follow-up with dentist - dental resources given.   Pt took 10 benadryl - PT was advised to take medication as directed, dosing of tylenol, ibuprofen, norco, and benadryl explained to pt.  He verbalized understanding. No signs of toxidrome - pupils equal and reactive, normal cognition, muscle tone, pt was well appearing, alert and talkative, not tachycardic or febrile - BP - systolic mildly elevated, pt denied any complaints other than his tooth ache.  Stable to d/c home.  Agreed to seek help from dentist.  Pt was without tenderness to gums or cheek, did not feel abx were indicated, pt declined dental block.  I personally performed the services described in this documentation, which was scribed in my presence. The recorded information has been reviewed and is accurate.     Danelle Berry, PA-C 09/12/14 0115  Tilden Fossa, MD 09/18/14 [phone]

## 2014-09-09 NOTE — Discharge Instructions (Signed)

## 2014-09-09 NOTE — ED Notes (Signed)
Patient asked for and received a cup of apple juice.

## 2014-09-09 NOTE — ED Notes (Signed)
Pt. reports worsening left lower molar pain for several months.

## 2014-09-11 ENCOUNTER — Telehealth (HOSPITAL_BASED_OUTPATIENT_CLINIC_OR_DEPARTMENT_OTHER): Payer: Self-pay | Admitting: Emergency Medicine

## 2015-06-24 ENCOUNTER — Emergency Department (HOSPITAL_COMMUNITY)
Admission: EM | Admit: 2015-06-24 | Discharge: 2015-06-24 | Disposition: A | Discharge status: Home / Self Care | Payer: Medicaid Other | Attending: Emergency Medicine | Admitting: Emergency Medicine

## 2015-06-24 ENCOUNTER — Encounter (HOSPITAL_COMMUNITY): Payer: Self-pay | Admitting: Emergency Medicine

## 2015-06-24 DIAGNOSIS — Z79899 Other long term (current) drug therapy: Secondary | ICD-10-CM | POA: Insufficient documentation

## 2015-06-24 DIAGNOSIS — J020 Streptococcal pharyngitis: Secondary | ICD-10-CM | POA: Insufficient documentation

## 2015-06-24 DIAGNOSIS — F17200 Nicotine dependence, unspecified, uncomplicated: Secondary | ICD-10-CM | POA: Insufficient documentation

## 2015-06-24 DIAGNOSIS — Z791 Long term (current) use of non-steroidal anti-inflammatories (NSAID): Secondary | ICD-10-CM | POA: Insufficient documentation

## 2015-06-24 LAB — RAPID STREP SCREEN (MED CTR MEBANE ONLY): Streptococcus, Group A Screen (Direct): POSITIVE — AB

## 2015-06-24 MED ORDER — PENICILLIN G BENZATHINE 1200000 UNIT/2ML IM SUSP
1.2000 10*6.[IU] | Freq: Once | INTRAMUSCULAR | Status: AC
  Administered 2015-06-24: 1.2 10*6.[IU] via INTRAMUSCULAR
  Filled 2015-06-24: qty 2

## 2015-06-24 NOTE — Progress Notes (Signed)
Pt confirms medicaid but not sure which type of medicaid he has Pt referred to DSS [id number] to find out Pt encouraged to review medicaid list for [place] and call to see which doctor in his zip code is accepting new patients to tell DSS which Dr he will be seeing

## 2015-06-24 NOTE — ED Notes (Signed)
Pt states that he woke up 2 days ago with a sore throat and spit up blood 1 time. Now throat just hurts. Alert and oriented.

## 2015-06-24 NOTE — ED Provider Notes (Signed)
CSN: [id number]     Arrival date & time 06/24/15  1608 History  By signing my name below, I, Placido SouLogan Joldersma, attest that this documentation has been prepared under the direction and in the presence of Newell RubbermaidJeffrey Rion Catala, PA-C. Electronically Signed: Placido SouLogan Joldersma, ED Scribe. 06/24/2015. 5:21 PM.   Chief Complaint  Patient presents with  . Sore Throat   The history is provided by the patient. No language interpreter was used.    HPI Comments: Larry Kline is a 30 y.o. male who presents to the Emergency Department complaining of constant, moderate, sore throat x 3 days. He reports a worsening of his pain when swallowing and denies any alleviating factors. He denies fever, chills, cough or any other associated symptoms at this time.   History reviewed. No pertinent past medical history. History reviewed. No pertinent past surgical history. Family History  Problem Relation Age of Onset  . Family history unknown: Yes   Social History  Substance Use Topics  . Smoking status: Current Every Day Smoker -- 0.33 packs/day  . Smokeless tobacco: None  . Alcohol Use: Yes    Review of Systems A complete 10 system review of systems was obtained and all systems are negative except as noted in the HPI and PMH.   Allergies  Review of patient's allergies indicates no known allergies.  Home Medications   Prior to Admission medications   Medication Sig Start Date End Date Taking? Authorizing Provider  HYDROcodone-acetaminophen (NORCO/VICODIN) 5-325 MG per tablet Take 2 tablets by mouth every 4 (four) hours as needed. 09/09/14   Danelle BerryLeisa Tapia, PA-C  ibuprofen (ADVIL,MOTRIN) 800 MG tablet Take 1 tablet (800 mg total) by mouth 3 (three) times daily. 09/09/14   Danelle BerryLeisa Tapia, PA-C  ketotifen (ZADITOR) 0.025 % ophthalmic solution Place 1-2 drops into the left eye 2 (two) times daily. 07/21/14   Ozella Rocksavid J Merrell, MD   BP 136/73 mmHg  Pulse 65  Temp(Src) 98.5 F (36.9 C) (Oral)  Resp 18  SpO2 94%    Physical  Exam  Constitutional: He is oriented to person, place, and time. He appears well-developed and well-nourished.  HENT:  Head: Normocephalic and atraumatic.  Mouth/Throat: Uvula is midline. No oropharyngeal exudate or posterior oropharyngeal edema.  Mild, bilateral, equal, swelling of tonsils; uvula midline; no postpharyngeal edema or exudates  Eyes: EOM are normal.  Neck: Normal range of motion.  Cardiovascular: Normal rate.   Pulmonary/Chest: Effort normal. No respiratory distress.  Abdominal: Soft.  Musculoskeletal: Normal range of motion.  Neurological: He is alert and oriented to person, place, and time.  Skin: Skin is warm and dry.  Psychiatric: He has a normal mood and affect.  Nursing note and vitals reviewed.   ED Course  Procedures  DIAGNOSTIC STUDIES: Oxygen Saturation is 94% on RA, adequate by my interpretation.    COORDINATION OF CARE: 5:19 PM Discussed next steps with pt. He verbalized understanding and is agreeable with the plan.   Labs Review Labs Reviewed  RAPID STREP SCREEN (NOT AT [facility]) - Abnormal; Notable for the following:    Streptococcus, Group A Screen (Direct) POSITIVE (*)    All other components within normal limits    Imaging Review No results found. I have personally reviewed and evaluated these lab results as part of my medical decision-making.   EKG Interpretation None      MDM   Final diagnoses:  Strep pharyngitis    Labs: rapid strep screen  Imaging: none indicated  Consults: none  Therapeutics: Penicillin  Assessment: Pt presents with likely strep pharyngitis. No difficulty swallowing, drooling, dysphonia,muffled voice, stridor, swelling of the neck, trismus, mouth pain, swelling/ pain in submandibular area or floor of mouth ,assymetry of tonsils, or ulcerations; unlikely epiglottitis, PTA, submandibular space infection, retropharyngeal space infection, or HIV. Pt treated here in the ED with therapeutics listed above, given strict  return precautions, PCP follow-up for re-evaluation if symptoms persist beyond 5-7 days in duration, return to the ED if they worsen. Pt verbalized understanding and agreement to today's plan and had no further questions or concerns at the time of discharge.      I personally performed the services described in this documentation, which was scribed in my presence. The recorded information has been reviewed and is accurate.   Eyvonne Mechanic, PA-C 06/24/15 2018  Eyvonne Mechanic, PA-C 06/24/15 2018  Lorre Nick, MD 06/28/15 [phone]

## 2016-04-24 ENCOUNTER — Emergency Department (HOSPITAL_COMMUNITY)
Admission: EM | Admit: 2016-04-24 | Discharge: 2016-04-24 | Disposition: A | Discharge status: Home / Self Care | Payer: No Typology Code available for payment source | Attending: Emergency Medicine | Admitting: Emergency Medicine

## 2016-04-24 ENCOUNTER — Encounter (HOSPITAL_COMMUNITY): Payer: Self-pay

## 2016-04-24 ENCOUNTER — Emergency Department (HOSPITAL_COMMUNITY): Payer: No Typology Code available for payment source

## 2016-04-24 DIAGNOSIS — Y92410 Unspecified street and highway as the place of occurrence of the external cause: Secondary | ICD-10-CM | POA: Diagnosis not present

## 2016-04-24 DIAGNOSIS — S239XXA Sprain of unspecified parts of thorax, initial encounter: Secondary | ICD-10-CM

## 2016-04-24 DIAGNOSIS — S233XXA Sprain of ligaments of thoracic spine, initial encounter: Secondary | ICD-10-CM | POA: Insufficient documentation

## 2016-04-24 DIAGNOSIS — Y999 Unspecified external cause status: Secondary | ICD-10-CM | POA: Insufficient documentation

## 2016-04-24 DIAGNOSIS — S299XXA Unspecified injury of thorax, initial encounter: Secondary | ICD-10-CM | POA: Diagnosis present

## 2016-04-24 DIAGNOSIS — F17200 Nicotine dependence, unspecified, uncomplicated: Secondary | ICD-10-CM | POA: Insufficient documentation

## 2016-04-24 DIAGNOSIS — Y939 Activity, unspecified: Secondary | ICD-10-CM | POA: Insufficient documentation

## 2016-04-24 MED ORDER — LIDOCAINE 5 % EX PTCH: 1.0000 | MEDICATED_PATCH | CUTANEOUS | 0 refills | Status: DC

## 2016-04-24 MED ORDER — DICLOFENAC SODIUM 50 MG PO TBEC: 50.0000 mg | DELAYED_RELEASE_TABLET | Freq: Two times a day (BID) | ORAL | 0 refills | Status: DC
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
## 2016-04-24 NOTE — ED Notes (Signed)
Patient transported to X-ray 
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
## 2018-11-23 ENCOUNTER — Other Ambulatory Visit: Payer: Self-pay

## 2018-11-23 ENCOUNTER — Emergency Department (HOSPITAL_COMMUNITY)
Admission: EM | Admit: 2018-11-23 | Discharge: 2018-11-23 | Disposition: A | Discharge status: Home / Self Care | Payer: Self-pay | Attending: Emergency Medicine | Admitting: Emergency Medicine

## 2018-11-23 DIAGNOSIS — H6002 Abscess of left external ear: Secondary | ICD-10-CM | POA: Insufficient documentation

## 2018-11-23 DIAGNOSIS — F17210 Nicotine dependence, cigarettes, uncomplicated: Secondary | ICD-10-CM | POA: Insufficient documentation

## 2018-11-23 MED ORDER — CIPROFLOXACIN-DEXAMETHASONE 0.3-0.1 % OT SUSP
4.0000 [drp] | Freq: Once | OTIC | Status: AC
  Administered 2018-11-23: 4 [drp] via OTIC
  Filled 2018-11-23: qty 7.5

## 2018-11-23 NOTE — ED Provider Notes (Signed)
[facility] Provider Note   CSN: [id number] Arrival date & time: 11/23/18  1510     History   Chief Complaint Chief Complaint  Patient presents with  . Otalgia    HPI Larry Kline is a 33 y.o. male.     Patient presents emergency department with complaint of swelling drainage of her left ear.  Patient states that the drainage is been ongoing for about 2 days.  She is unable to put her finger in the ear because of the swelling.  Denies any trauma.  No right ear complaints.  No fever or URI symptoms.  Drainage is thick like pus.  No hearing change.  Onset of symptoms acute.  Course is constant.  Patient also requesting "a complete checkup".  Will check CBG.  Will refer to primary care for further evaluation.     No past medical history on file.  Patient Active Problem List   Diagnosis Date Noted  . TOBACCO USER 03/11/2009  . OBESITY, NOS 05/06/2006    No past surgical history on file.      Home Medications    Prior to Admission medications   Medication Sig Start Date End Date Taking? Authorizing Provider  diclofenac (VOLTAREN) 50 MG EC tablet Take 1 tablet (50 mg total) by mouth 2 (two) times daily. 04/24/16   Janne NapoleonNeese, Hope M, NP  lidocaine (LIDODERM) 5 % Place 1 patch onto the skin daily. Remove & Discard patch within 12 hours or as directed by MD 04/24/16   Janne NapoleonNeese, Hope M, NP    Family History Family History  Family history unknown: Yes    Social History Social History   Tobacco Use  . Smoking status: Current Every Day Smoker    Packs/day: 0.33  . Smokeless tobacco: Never Used  Substance Use Topics  . Alcohol use: Yes  . Drug use: No     Allergies   Patient has no known allergies.   Review of Systems Review of Systems  Constitutional: Negative for chills, fatigue and fever.  HENT: Positive for ear discharge and ear pain. Negative for congestion, rhinorrhea, sinus pressure and sore throat.   Eyes: Negative for  redness.  Gastrointestinal: Negative for nausea and vomiting.  Endocrine: Negative for polydipsia and polyuria.  Skin: Negative for rash.  Neurological: Negative for headaches.  Hematological: Negative for adenopathy.     Physical Exam Updated Vital Signs BP (!) 143/59 (BP Location: Left Arm)   Pulse 78   Temp 98 F (36.7 C) (Oral)   Resp 18   SpO2 95%   Physical Exam Vitals signs and nursing note reviewed.  Constitutional:      Appearance: He is well-developed.  HENT:     Head: Normocephalic and atraumatic.     Right Ear: Hearing, ear canal and external ear normal. No drainage or swelling. Tympanic membrane is not scarred, perforated or erythematous.     Left Ear: Hearing normal. Drainage and swelling present. Tympanic membrane is not scarred, perforated or erythematous.     Ears:   Eyes:     Conjunctiva/sclera: Conjunctivae normal.  Neck:     Musculoskeletal: Normal range of motion and neck supple.  Pulmonary:     Effort: No respiratory distress.  Skin:    General: Skin is warm and dry.  Neurological:     Mental Status: He is alert.      ED Treatments / Results  Labs (all labs ordered are listed, but only abnormal results are  displayed) Labs Reviewed  CBG MONITORING, ED    EKG None  Radiology No results found.  Procedures Procedures (including critical care time)  Medications Ordered in ED Medications  ciprofloxacin-dexamethasone (CIPRODEX) 0.3-0.1 % OTIC (EAR) suspension 4 drop (has no administration in time range)     Initial Impression / Assessment and Plan / ED Course  I have reviewed the triage vital signs and the nursing notes.  Pertinent labs & imaging results that were available during my care of the patient were reviewed by me and considered in my medical decision making (see chart for details).        Patient seen and examined.  Will check CBG, start on Ciprodex eardrops as well as Bactrim.  Area has the appearance of a small  abscess however cannot entirely rule out otitis externa.  Will treat with both oral antibiotics and eardrops.  Area is spontaneously draining, very small, poorly assessable.  Do not feel that I&D is appropriate at this time.  Vital signs reviewed and are as follows: BP (!) 143/59 (BP Location: Left Arm)   Pulse 78   Temp 98 F (36.7 C) (Oral)   Resp 18   SpO2 95%   4:54 PM CBG was 102.   Home with above treatment.  Encourage PCP follow-up as needed for checkup.   Final Clinical Impressions(s) / ED Diagnoses   Final diagnoses:  Abscess of ear canal, left   Patient with left external otitis media versus small abscess at the auditory meatus.  Treatment as above.  Patient appears well.   ED Discharge Orders    None       Carlisle Cater, Hershal Coria 11/23/18 1655    Maudie Flakes, MD 11/26/18 [phone]

## 2018-11-23 NOTE — ED Triage Notes (Signed)
Pt presents with Left ear swelling x2 days, "unable to put his finger in there" Pt also requesting a full work up while here, states " I want top check all of me out, it's been a minute." pt patting his head non stop in triage

## 2018-11-23 NOTE — ED Notes (Signed)
Pt left with reviewing discharge papers with nurse.

## 2018-11-23 NOTE — Discharge Instructions (Signed)
Please read and follow all provided instructions.  Your diagnoses today include:  1. Abscess of ear canal, left     Tests performed today include:  Vital signs. See below for your results today.   Medications prescribed:   Bactrim (trimethoprim/sulfamethoxazole) - antibiotic  You have been prescribed an antibiotic medicine: take the entire course of medicine even if you are feeling better. Stopping early can cause the antibiotic not to work.   Ciprodex ear drops - instill 4 drops into affected ear twice daily for 7 days  Take any prescribed medications only as directed.   Home care instructions:   Follow any educational materials contained in this packet  Follow-up instructions: Return to the Emergency Department in 48 hours for a recheck if your symptoms are not significantly improved.  Please follow-up with your primary care provider in the next 1 week for further evaluation of your symptoms.   Return instructions:  Return to the Emergency Department if you have:  Fever  Worsening symptoms  Worsening pain  Worsening swelling  Redness of the skin that moves away from the affected area, especially if it streaks away from the affected area   Any other emergent concerns  Your vital signs today were: BP (!) 143/59 (BP Location: Left Arm)    Pulse 78    Temp 98 F (36.7 C) (Oral)    Resp 18    SpO2 95%  If your blood pressure (BP) was elevated above 135/85 this visit, please have this repeated by your doctor within one month. --------------

## 2018-11-24 LAB — CBG MONITORING, ED: Glucose-Capillary: 102 mg/dL — ABNORMAL HIGH (ref 70–99)

## 2019-05-10 ENCOUNTER — Encounter (HOSPITAL_COMMUNITY): Payer: Self-pay

## 2019-05-10 ENCOUNTER — Ambulatory Visit (HOSPITAL_COMMUNITY)
Admission: EM | Admit: 2019-05-10 | Discharge: 2019-05-10 | Disposition: A | Discharge status: Home / Self Care | Payer: Self-pay | Attending: Family Medicine | Admitting: Family Medicine

## 2019-05-10 ENCOUNTER — Other Ambulatory Visit: Payer: Self-pay

## 2019-05-10 DIAGNOSIS — B360 Pityriasis versicolor: Secondary | ICD-10-CM | POA: Insufficient documentation

## 2019-05-10 DIAGNOSIS — Z113 Encounter for screening for infections with a predominantly sexual mode of transmission: Secondary | ICD-10-CM

## 2019-05-10 DIAGNOSIS — R21 Rash and other nonspecific skin eruption: Secondary | ICD-10-CM

## 2019-05-10 LAB — RAPID HIV SCREEN (HIV 1/2 AB+AG)
HIV 1/2 Antibodies: NONREACTIVE
HIV-1 P24 Antigen - HIV24: NONREACTIVE

## 2019-05-10 MED ORDER — FLUCONAZOLE 200 MG PO TABS: ORAL_TABLET | ORAL | 0 refills | Status: DC

## 2019-05-10 MED ORDER — CLOTRIMAZOLE 1 % EX CREA: TOPICAL_CREAM | CUTANEOUS | 0 refills | Status: DC

## 2019-05-10 NOTE — ED Triage Notes (Signed)
Pt state he was told he needed to get checked for STDs by his partner.

## 2019-05-10 NOTE — ED Provider Notes (Signed)
[facility]    CSN: [id number] Arrival date & time: 05/10/19  1743      History   Chief Complaint Chief Complaint  Patient presents with  . SEXUALLY TRANSMITTED DISEASE    HPI Larry Kline is a 34 y.o. male.   Reports that his sexual partner told him that he needs to get checked for STDs.  Denies recalling any one STD that was mentioned, or that his partner stated that they had an STD.  Also reports that he has had a rash that comes and goes all over his body that he has been using moisturizers on, with no relief.  Denies itching, burning.  Reports that the rash darkens his skin color where ever it shows up.  Denies penile discharge, dysuria, penile pain.  Denies fever, headache, sore throat, rash, chills, abdominal pain, other symptoms.  ROS Per HPI  The history is provided by the patient.    History reviewed. No pertinent past medical history.  Patient Active Problem List   Diagnosis Date Noted  . TOBACCO USER 03/11/2009  . OBESITY, NOS 05/06/2006    History reviewed. No pertinent surgical history.     Home Medications    Prior to Admission medications   Medication Sig Start Date End Date Taking? Authorizing Provider  clotrimazole (LOTRIMIN) 1 % cream Apply to affected area 2 times daily 05/10/19   Moshe Cipro, NP  diclofenac (VOLTAREN) 50 MG EC tablet Take 1 tablet (50 mg total) by mouth 2 (two) times daily. 04/24/16   Janne Napoleon, NP  fluconazole (DIFLUCAN) 200 MG tablet Take 1.5 tabs today and another 1.5 tabs in one week. 05/10/19   Moshe Cipro, NP  lidocaine (LIDODERM) 5 % Place 1 patch onto the skin daily. Remove & Discard patch within 12 hours or as directed by MD 04/24/16   Janne Napoleon, NP    Family History Family History  Family history unknown: Yes    Social History Social History   Tobacco Use  . Smoking status: Current Every Day Smoker    Packs/day: 0.33  . Smokeless tobacco: Never Used  Substance Use Topics  .  Alcohol use: Yes  . Drug use: No     Allergies   Patient has no known allergies.   Review of Systems Review of Systems   Physical Exam Triage Vital Signs ED Triage Vitals  Enc Vitals Group     BP      Pulse      Resp      Temp      Temp src      SpO2      Weight      Height      Head Circumference      Peak Flow      Pain Score      Pain Loc      Pain Edu?      Excl. in GC?    No data found.  Updated Vital Signs BP (!) 146/72 (BP Location: Right Arm)   Pulse 65   Temp 98.6 F (37 C) (Oral)   Resp 18   Wt (!) 328 lb (148.8 kg)   SpO2 100%   BMI 45.75 kg/m   Visual Acuity Right Eye Distance:   Left Eye Distance:   Bilateral Distance:    Right Eye Near:   Left Eye Near:    Bilateral Near:     Physical Exam Vitals and nursing note reviewed.  Constitutional:  General: He is not in acute distress.    Appearance: He is well-developed. He is obese.  HENT:     Head: Normocephalic and atraumatic.  Eyes:     Conjunctiva/sclera: Conjunctivae normal.  Cardiovascular:     Rate and Rhythm: Normal rate and regular rhythm.     Heart sounds: Normal heart sounds. No murmur.  Pulmonary:     Effort: Pulmonary effort is normal. No respiratory distress.     Breath sounds: Normal breath sounds. No stridor. No wheezing, rhonchi or rales.  Chest:     Chest wall: No tenderness.  Abdominal:     General: There is no distension.     Palpations: Abdomen is soft. There is no mass.     Tenderness: There is no abdominal tenderness. There is no guarding or rebound.     Hernia: No hernia is present.  Musculoskeletal:     Cervical back: Neck supple.  Skin:    General: Skin is warm and dry.     Findings: Rash present.     Comments: Large generalized, well-circumscribed areas of darkened skin all over the patient's body.  Of note, skin was especially dark around the groin.  Neurological:     General: No focal deficit present.     Mental Status: He is alert and  oriented to person, place, and time.  Psychiatric:        Mood and Affect: Mood normal.        Behavior: Behavior normal.      UC Treatments / Results  Labs (all labs ordered are listed, but only abnormal results are displayed) Labs Reviewed  RPR  RAPID HIV SCREEN (HIV 1/2 AB+AG)  CYTOLOGY, (ORAL, ANAL, URETHRAL) ANCILLARY ONLY    EKG   Radiology No results found.  Procedures Procedures (including critical care time)  Medications Ordered in UC Medications - No data to display  Initial Impression / Assessment and Plan / UC Course  I have reviewed the triage vital signs and the nursing notes.  Pertinent labs & imaging results that were available during my care of the patient were reviewed by me and considered in my medical decision making (see chart for details).    STD tests are pending, will inform patient of results and treat accordingly.  Patient instructed to abstain from sex until testing is back and negative.  Rash is likely tinea versicolor.  Fluconazole 300 mg sent in, take 1-1/2 tablets today, and 1-1/2 tablets in 1 week if symptoms are still present.  May also use clotrimazole cream to groin area as needed twice daily.  Instructed to follow-up with primary care, or this office if symptoms are persisting.    Final Clinical Impressions(s) / UC Diagnoses   Final diagnoses:  Tinea versicolor  Screen for STD (sexually transmitted disease)     Discharge Instructions     Tinea versicolor, fungal infection. Take 1.5 tabs today and 1.5 tabs in one week.  Your STD tests are pending.  If your test results are positive, we will call you.  You may need additional treatment and your partner(s) may also need treatment.          ED Prescriptions    Medication Sig Dispense Auth. Provider   clotrimazole (LOTRIMIN) 1 % cream Apply to affected area 2 times daily 15 g Moshe Cipro, NP   fluconazole (DIFLUCAN) 200 MG tablet Take 1.5 tabs today and another 1.5  tabs in one week. 3 tablet Moshe Cipro, NP  PDMP not reviewed this encounter.   Faustino Congress, NP 05/11/19 1430

## 2019-05-10 NOTE — Discharge Instructions (Addendum)
Tinea versicolor, fungal infection. Take 1.5 tabs today and 1.5 tabs in one week.  Your STD tests are pending.  If your test results are positive, we will call you.  You may need additional treatment and your partner(s) may also need treatment.

## 2019-05-11 LAB — CYTOLOGY, (ORAL, ANAL, URETHRAL) ANCILLARY ONLY
Chlamydia: NEGATIVE
Neisseria Gonorrhea: NEGATIVE
Trichomonas: NEGATIVE

## 2019-05-11 LAB — RPR: RPR Ser Ql: NONREACTIVE

## 2019-10-10 ENCOUNTER — Other Ambulatory Visit: Payer: Self-pay

## 2019-10-10 ENCOUNTER — Encounter (HOSPITAL_COMMUNITY): Payer: Self-pay | Admitting: Emergency Medicine

## 2019-10-10 ENCOUNTER — Emergency Department (HOSPITAL_COMMUNITY)
Admission: EM | Admit: 2019-10-10 | Discharge: 2019-10-10 | Disposition: A | Discharge status: Home / Self Care | Payer: Medicaid Other | Attending: Emergency Medicine | Admitting: Emergency Medicine

## 2019-10-10 DIAGNOSIS — B360 Pityriasis versicolor: Secondary | ICD-10-CM

## 2019-10-10 DIAGNOSIS — K0889 Other specified disorders of teeth and supporting structures: Secondary | ICD-10-CM

## 2019-10-10 DIAGNOSIS — F17210 Nicotine dependence, cigarettes, uncomplicated: Secondary | ICD-10-CM | POA: Insufficient documentation

## 2019-10-10 MED ORDER — FLUCONAZOLE 150 MG PO TABS
300.0000 mg | ORAL_TABLET | Freq: Once | ORAL | Status: AC
  Administered 2019-10-10: 300 mg via ORAL
  Filled 2019-10-10: qty 2

## 2019-10-10 MED ORDER — AMOXICILLIN-POT CLAVULANATE 875-125 MG PO TABS: 1.0000 | ORAL_TABLET | Freq: Two times a day (BID) | ORAL | 0 refills | Status: DC

## 2019-10-10 MED ORDER — FLUCONAZOLE 150 MG PO TABS: 300.0000 mg | ORAL_TABLET | Freq: Every day | ORAL | 0 refills | Status: AC

## 2019-10-10 NOTE — ED Notes (Signed)
Reached out to social work for further resources for dental assistance

## 2019-10-10 NOTE — Discharge Instructions (Addendum)
For your rash you were given a dose of fluconazole in the emergency department.  You were given a second tablet to take in 1 week from today.  You are also given antibiotics to treat a potential dental infection.  Please follow-up with a dentist within the next 1 week for reassessment.  Return the emergency department for new or worsening symptoms.

## 2019-10-10 NOTE — ED Provider Notes (Signed)
[name] EMERGENCY DEPARTMENT Provider Note   CSN: [id number] Arrival date & time: 10/10/19  9150     History Chief Complaint  Patient presents with  . Dental Pain    Larry Kline is a 34 y.o. male.  HPI   34 year old male presenting for evaluation of right lower dental pain.  States he had some fevers earlier in the week but they have since resolved.  Pain is constant and severe in nature.  Additionally complains of a rash to the back, and bilateral antecubital fossa's.  States he was seen at urgent care earlier in the year and given a medication which helped symptoms.  States the rash is itchy.  History reviewed. No pertinent past medical history.  Patient Active Problem List   Diagnosis Date Noted  . TOBACCO USER 03/11/2009  . OBESITY, NOS 05/06/2006    History reviewed. No pertinent surgical history.     Family History  Family history unknown: Yes    Social History   Tobacco Use  . Smoking status: Current Every Day Smoker    Packs/day: 0.33  . Smokeless tobacco: Never Used  Substance Use Topics  . Alcohol use: Yes  . Drug use: No    Home Medications Prior to Admission medications   Medication Sig Start Date End Date Taking? Authorizing Provider  amoxicillin-clavulanate (AUGMENTIN) 875-125 MG tablet Take 1 tablet by mouth every 12 (twelve) hours. 10/10/19   Cruzita Lipa S, PA-C  clotrimazole (LOTRIMIN) 1 % cream Apply to affected area 2 times daily 05/10/19   Moshe Cipro, NP  diclofenac (VOLTAREN) 50 MG EC tablet Take 1 tablet (50 mg total) by mouth 2 (two) times daily. 04/24/16   Janne Napoleon, NP  fluconazole (DIFLUCAN) 150 MG tablet Take 2 tablets (300 mg total) by mouth daily for 1 dose. 10/10/19 10/11/19  Forestine Macho S, PA-C  lidocaine (LIDODERM) 5 % Place 1 patch onto the skin daily. Remove & Discard patch within 12 hours or as directed by MD 04/24/16   Janne Napoleon, NP    Allergies    Patient has no known  allergies.  Review of Systems   Review of Systems  Constitutional: Positive for fever (resolved).  HENT: Positive for dental problem. Negative for facial swelling, trouble swallowing and voice change.   Respiratory: Negative for cough, shortness of breath and wheezing.   Cardiovascular: Negative for chest pain.  Gastrointestinal: Negative for abdominal pain.  Musculoskeletal: Negative for back pain.  Skin: Positive for rash.  Neurological: Negative for headaches.    Physical Exam Updated Vital Signs BP 120/63 (BP Location: Right Arm)   Pulse (!) 54   Temp 97.9 F (36.6 C) (Oral)   Resp 20   Ht 5\' 11"  (1.803 m)   Wt (!) 146.5 kg   SpO2 96%   BMI 45.05 kg/m   Physical Exam Constitutional:      General: He is not in acute distress.    Appearance: He is well-developed.  HENT:     Mouth/Throat:     Comments: Multiple dental caries, right lower molar noted to have dental carry, TTP to the gum posterior to this.  No obvious fluctuance noted.  No swelling to the sublingual space or submandibular space. Eyes:     Conjunctiva/sclera: Conjunctivae normal.  Cardiovascular:     Rate and Rhythm: Normal rate.  Pulmonary:     Effort: Pulmonary effort is normal.  Skin:    General: Skin is warm and dry.  Comments: Large generalized, well-circumscribed areas of darkened skin all over the patient's body.    Neurological:     Mental Status: He is alert and oriented to person, place, and time.     ED Results / Procedures / Treatments   Labs (all labs ordered are listed, but only abnormal results are displayed) Labs Reviewed - No data to display  EKG None  Radiology No results found.  Procedures Procedures (including critical care time)  Medications Ordered in ED Medications  fluconazole (DIFLUCAN) tablet 300 mg (300 mg Oral Given 10/10/19 1233)    ED Course  I have reviewed the triage vital signs and the nursing notes.  Pertinent labs & imaging results that were  available during my care of the patient were reviewed by me and considered in my medical decision making (see chart for details).    MDM Rules/Calculators/A&P                          Patient with toothache.  No gross abscess.  Exam unconcerning for Ludwig's angina or spread of infection.  Will treat with augmentin and pain medicine.  Urged patient to follow-up with dentist.  Patient also with generalized rash consistent with tinea versicolor.  Treated with fluconazole.  Advised PCP and dental follow-up.  Advised on return precautions.  Patient voices understanding of the plan and reasons to return.  All questions answered.  Patient stable for discharge   Final Clinical Impression(s) / ED Diagnoses Final diagnoses:  Tinea versicolor  Pain, dental    Rx / DC Orders ED Discharge Orders         Ordered    fluconazole (DIFLUCAN) 150 MG tablet  Daily     Discontinue  Reprint     10/10/19 1211    amoxicillin-clavulanate (AUGMENTIN) 875-125 MG tablet  Every 12 hours     Discontinue  Reprint     10/10/19 [address] Patryk Conant S, PA-C 10/10/19 1308    Tegeler, Canary Brim, MD 10/10/19 1526

## 2019-10-10 NOTE — ED Notes (Signed)
Patient verbalizes understanding of discharge instructions. Opportunity for questioning and answers were provided. Armband removed by staff, pt discharged from ED. Ambulated out to lobby  

## 2019-10-10 NOTE — ED Triage Notes (Signed)
Pt arrives to ED with c/o of right lower dental pain feels like it is there wisdom tooth, pain in right ear as well.

## 2019-11-24 ENCOUNTER — Telehealth (HOSPITAL_COMMUNITY): Payer: Self-pay

## 2019-11-24 ENCOUNTER — Other Ambulatory Visit: Payer: Self-pay

## 2019-11-24 ENCOUNTER — Emergency Department (HOSPITAL_COMMUNITY)
Admission: EM | Admit: 2019-11-24 | Discharge: 2019-11-24 | Disposition: A | Discharge status: Home / Self Care | Payer: HRSA Program | Attending: Emergency Medicine | Admitting: Emergency Medicine

## 2019-11-24 DIAGNOSIS — F17200 Nicotine dependence, unspecified, uncomplicated: Secondary | ICD-10-CM | POA: Insufficient documentation

## 2019-11-24 DIAGNOSIS — R05 Cough: Secondary | ICD-10-CM | POA: Diagnosis present

## 2019-11-24 DIAGNOSIS — U071 COVID-19: Secondary | ICD-10-CM | POA: Insufficient documentation

## 2019-11-24 DIAGNOSIS — Z79899 Other long term (current) drug therapy: Secondary | ICD-10-CM | POA: Diagnosis not present

## 2019-11-24 DIAGNOSIS — Z20822 Contact with and (suspected) exposure to covid-19: Secondary | ICD-10-CM

## 2019-11-24 LAB — SARS CORONAVIRUS 2 BY RT PCR (HOSPITAL ORDER, PERFORMED IN ~~LOC~~ HOSPITAL LAB): SARS Coronavirus 2: POSITIVE — AB

## 2019-11-24 MED ORDER — ONDANSETRON 4 MG PO TBDP
4.0000 mg | ORAL_TABLET | Freq: Once | ORAL | Status: DC
  Filled 2019-11-24: qty 1

## 2019-11-24 MED ORDER — ONDANSETRON 4 MG PO TBDP: 4.0000 mg | ORAL_TABLET | Freq: Three times a day (TID) | ORAL | 0 refills | Status: DC | PRN

## 2019-11-24 NOTE — Discharge Instructions (Addendum)
You likely have a viral illness.  This should be treated symptomatically. Use Tylenol or ibuprofen as needed for fevers or body aches. Use zofran as needed for nausea or vomiting. Make sure you stay well-hydrated with water. Wash your hands frequently to prevent spread of infection. Your Covid test was sent today.  If positive, you receive a phone call.  If negative, you will not.  Either way, you may check online on MyChart. You will need to quarantine for a total of 10 days from symptom onset.  You may end quarantine if you are fever free for 24 hours without medication and if your symptoms are not worsening. I have reached out to the monoclonal antibody infusion clinic to see if you qualify for treatment.  They will reach out to you for further information. Return to the emergency room if you develop chest pain, difficulty breathing, or any new or worsening symptoms.

## 2019-11-24 NOTE — ED Triage Notes (Signed)
Patient concern for Covid symptoms x 1 week; cough, chills, loss of taste and smell.

## 2019-11-24 NOTE — ED Provider Notes (Signed)
[facility] EMERGENCY DEPARTMENT Provider Note   CSN: [id number] Arrival date & time: 11/24/19  1207     History Chief Complaint  Patient presents with  . COVID SYMPTOMS    Larry Kline is a 34 y.o. male presenting for Covid symptoms for a week.  Patient states of the past week, he has had cough, nasal congestion, loss of taste and smell, nausea, decreased appetite.  He denies chest pain, shortness of breath, difficulty breathing.  He has not seen the Covid vaccines.  He does have a positive sick contact, but they have not been tested for Covid.  Patient was tested for Covid yesterday, but has not yet received the results.  He has no medical problems, takes medications daily.  No history of asthma or COPD.  HPI     No past medical history on file.  Patient Active Problem List   Diagnosis Date Noted  . TOBACCO USER 03/11/2009  . OBESITY, NOS 05/06/2006    No past surgical history on file.     Family History  Family history unknown: Yes    Social History   Tobacco Use  . Smoking status: Current Every Day Smoker    Packs/day: 0.33  . Smokeless tobacco: Never Used  Substance Use Topics  . Alcohol use: Yes  . Drug use: No    Home Medications Prior to Admission medications   Medication Sig Start Date End Date Taking? Authorizing Provider  amoxicillin-clavulanate (AUGMENTIN) 875-125 MG tablet Take 1 tablet by mouth every 12 (twelve) hours. 10/10/19   Couture, Cortni S, PA-C  clotrimazole (LOTRIMIN) 1 % cream Apply to affected area 2 times daily 05/10/19   Moshe Cipro, NP  diclofenac (VOLTAREN) 50 MG EC tablet Take 1 tablet (50 mg total) by mouth 2 (two) times daily. 04/24/16   Janne Napoleon, NP  lidocaine (LIDODERM) 5 % Place 1 patch onto the skin daily. Remove & Discard patch within 12 hours or as directed by MD 04/24/16   Janne Napoleon, NP  ondansetron (ZOFRAN ODT) 4 MG disintegrating tablet Take 1 tablet (4 mg total) by mouth every 8 (eight)  hours as needed for nausea or vomiting. 11/24/19   Kaytlin Burklow, PA-C    Allergies    Patient has no known allergies.  Review of Systems   Review of Systems  Constitutional: Positive for appetite change.  HENT: Positive for congestion.   Respiratory: Positive for cough.   All other systems reviewed and are negative.   Physical Exam Updated Vital Signs BP (!) 157/108 (BP Location: Right Arm)   Pulse 62   Temp 99.1 F (37.3 C) (Oral)   Resp 18   SpO2 98%   Physical Exam Vitals and nursing note reviewed.  Constitutional:      General: He is not in acute distress.    Appearance: He is well-developed.     Comments: Appears nontoxic  HENT:     Head: Normocephalic and atraumatic.  Eyes:     Conjunctiva/sclera: Conjunctivae normal.     Pupils: Pupils are equal, round, and reactive to light.  Cardiovascular:     Rate and Rhythm: Normal rate and regular rhythm.     Pulses: Normal pulses.  Pulmonary:     Effort: Pulmonary effort is normal. No respiratory distress.     Breath sounds: Normal breath sounds. No wheezing.     Comments: Speaking in full sentences.  Clear lung sounds in all fields.  Sats 100% on room air on  my evaluation Abdominal:     General: There is no distension.     Palpations: Abdomen is soft. There is no mass.     Tenderness: There is no abdominal tenderness. There is no guarding or rebound.  Musculoskeletal:        General: Normal range of motion.     Cervical back: Normal range of motion and neck supple.  Skin:    General: Skin is warm and dry.     Capillary Refill: Capillary refill takes less than 2 seconds.  Neurological:     Mental Status: He is alert and oriented to person, place, and time.     ED Results / Procedures / Treatments   Labs (all labs ordered are listed, but only abnormal results are displayed) Labs Reviewed  SARS CORONAVIRUS 2 BY RT PCR (HOSPITAL ORDER, PERFORMED IN [facility] LAB)    EKG None  Radiology No  results found.  Procedures Procedures (including critical care time)  Medications Ordered in ED Medications  ondansetron (ZOFRAN-ODT) disintegrating tablet 4 mg (has no administration in time range)    ED Course  I have reviewed the triage vital signs and the nursing notes.  Pertinent labs & imaging results that were available during my care of the patient were reviewed by me and considered in my medical decision making (see chart for details).    MDM Rules/Calculators/A&P                          Patient presented for evaluation of 1 week history of Covid symptoms.  On exam, patient appears nontoxic.  Likely viral illness, favor Covid considering loss of taste and smell.  Pulmonary exam is reassuring.  I do not believe he needs to be admitted for Covid.  Will test for Covid.  Zofran given for nausea control.  Encouraged continued monitoring of respiratory status.  If patient is Covid positive, BMI would qualify him for [facility] treatment.  Chey Cho was evaluated in Emergency Department on 11/24/2019 for the symptoms described in the history of present illness. He was evaluated in the context of the global COVID-19 pandemic, which necessitated consideration that the patient might be at risk for infection with the SARS-CoV-2 virus that causes COVID-19. Institutional protocols and algorithms that pertain to the evaluation of patients at risk for COVID-19 are in a state of rapid change based on information released by regulatory bodies including the CDC and federal and state organizations. These policies and algorithms were followed during the patient's care in the ED.  1:51 PM patient's Covid test is positive.  I contacted [facility] the clinic, as patient may qualify for infusion due to BMI.  Final Clinical Impression(s) / ED Diagnoses Final diagnoses:  Suspected COVID-19 virus infection    Rx / DC Orders ED Discharge Orders         Ordered    ondansetron (ZOFRAN ODT) 4 MG disintegrating  tablet  Every 8 hours PRN        11/24/19 1216           Janavia Rottman, PA-C 11/24/19 1352    Gwyneth Sprout, MD 11/25/19 1423

## 2019-11-25 ENCOUNTER — Emergency Department (HOSPITAL_COMMUNITY)
Admission: EM | Admit: 2019-11-25 | Discharge: 2019-11-25 | Disposition: A | Discharge status: Home / Self Care | Payer: HRSA Program | Attending: Emergency Medicine | Admitting: Emergency Medicine

## 2019-11-25 ENCOUNTER — Ambulatory Visit (HOSPITAL_COMMUNITY)
Admission: RE | Admit: 2019-11-25 | Discharge: 2019-11-25 | Disposition: A | Discharge status: Home / Self Care | Payer: Medicaid Other | Source: Ambulatory Visit | Attending: Pulmonary Disease | Admitting: Pulmonary Disease

## 2019-11-25 ENCOUNTER — Other Ambulatory Visit (HOSPITAL_COMMUNITY): Payer: Self-pay | Admitting: Nurse Practitioner

## 2019-11-25 ENCOUNTER — Telehealth: Payer: Self-pay | Admitting: Physician Assistant

## 2019-11-25 ENCOUNTER — Other Ambulatory Visit: Payer: Self-pay

## 2019-11-25 DIAGNOSIS — Z5321 Procedure and treatment not carried out due to patient leaving prior to being seen by health care provider: Secondary | ICD-10-CM | POA: Diagnosis not present

## 2019-11-25 DIAGNOSIS — U071 COVID-19: Secondary | ICD-10-CM | POA: Insufficient documentation

## 2019-11-25 MED ORDER — FAMOTIDINE IN NACL 20-0.9 MG/50ML-% IV SOLN: 20.0000 mg | Freq: Once | INTRAVENOUS | Status: DC | PRN

## 2019-11-25 MED ORDER — ACETAMINOPHEN 500 MG PO TABS: 1000.0000 mg | ORAL_TABLET | Freq: Once | ORAL | Status: DC

## 2019-11-25 MED ORDER — EPINEPHRINE 0.3 MG/0.3ML IJ SOAJ: 0.3000 mg | Freq: Once | INTRAMUSCULAR | Status: DC | PRN

## 2019-11-25 MED ORDER — METHYLPREDNISOLONE SODIUM SUCC 125 MG IJ SOLR: 125.0000 mg | Freq: Once | INTRAMUSCULAR | Status: DC | PRN

## 2019-11-25 MED ORDER — ALBUTEROL SULFATE HFA 108 (90 BASE) MCG/ACT IN AERS: 2.0000 | INHALATION_SPRAY | Freq: Once | RESPIRATORY_TRACT | Status: DC | PRN

## 2019-11-25 MED ORDER — SODIUM CHLORIDE 0.9 % IV SOLN
1200.0000 mg | Freq: Once | INTRAVENOUS | Status: AC
  Administered 2019-11-25: 1200 mg via INTRAVENOUS

## 2019-11-25 MED ORDER — DIPHENHYDRAMINE HCL 50 MG/ML IJ SOLN: 50.0000 mg | Freq: Once | INTRAMUSCULAR | Status: DC | PRN

## 2019-11-25 MED ORDER — SODIUM CHLORIDE 0.9 % IV SOLN: INTRAVENOUS | Status: DC | PRN

## 2019-11-25 NOTE — Progress Notes (Addendum)
  Diagnosis: COVID-19  Physician:Dr Wright  Procedure: Covid Infusion Clinic Med: casirivimab\imdevimab infusion - Provided patient with casirivimab\imdevimab fact sheet for patients, parents and caregivers prior to infusion.  Complications: No immediate complications noted.  Discharge: Discharged home   Larry Kline 11/25/2019  

## 2019-11-25 NOTE — ED Triage Notes (Signed)
Patient diagnosed with covid yesterday. Says today he feels weak and lethargic. Patient says he is here for antibody transfusion

## 2019-11-25 NOTE — Progress Notes (Signed)
I connected by phone with Larry Kline on 11/25/2019 at 4:31 PM to discuss the potential use of an new treatment for mild to moderate COVID-19 viral infection in non-hospitalized patients.  This patient is a 34 y.o. male that meets the FDA criteria for Emergency Use Authorization of casirivimab\imdevimab.  Has a (+) direct SARS-CoV-2 viral test result  Has mild or moderate COVID-19   Is ? 32 years of age and weighs ? 40 kg  Is NOT hospitalized due to COVID-19  Is NOT requiring oxygen therapy or requiring an increase in baseline oxygen flow rate due to COVID-19  Is within 10 days of symptom onset  Has at least one of the high risk factor(s) for progression to severe COVID-19 and/or hospitalization as defined in EUA.  Specific high risk criteria : BMI > 25 and Other high risk medical condition per CDC:  SVI  Onset 11/17/19.   I have spoken and communicated the following to the patient or parent/caregiver:  1. FDA has authorized the emergency use of casirivimab\imdevimab for the treatment of mild to moderate COVID-19 in adults and pediatric patients with positive results of direct SARS-CoV-2 viral testing who are 70 years of age and older weighing at least 40 kg, and who are at high risk for progressing to severe COVID-19 and/or hospitalization.  2. The significant known and potential risks and benefits of casirivimab\imdevimab, and the extent to which such potential risks and benefits are unknown.  3. Information on available alternative treatments and the risks and benefits of those alternatives, including clinical trials.  4. Patients treated with casirivimab\imdevimab should continue to self-isolate and use infection control measures (e.g., wear mask, isolate, social distance, avoid sharing personal items, clean and disinfect "high touch" surfaces, and frequent handwashing) according to CDC guidelines.   5. The patient or parent/caregiver has the option to accept or refuse  casirivimab\imdevimab .  After reviewing this information with the patient, The patient agreed to proceed with receiving casirivimab\imdevimab infusion and will be provided a copy of the Fact sheet prior to receiving the infusion.Consuello Masse, DNP, AGNP-C [phone] (Infusion Center Hotline)

## 2019-11-25 NOTE — Telephone Encounter (Signed)
  Called to discuss with patient about Covid symptoms and the use of casirivimab/imdevimab, a monoclonal antibody infusion for those with mild to moderate Covid symptoms and at a high risk of hospitalization.  Pt is qualified for this infusion at the [facility] infusion center due  BMI per ER note.   No voice mail. Sent my chart message.   Manson Passey, PA - C

## 2019-11-25 NOTE — ED Notes (Signed)
Pt no longer visualized in lobby, called for multiple times.

## 2019-11-25 NOTE — Discharge Instructions (Signed)

## 2019-12-11 ENCOUNTER — Ambulatory Visit (HOSPITAL_COMMUNITY)
Admission: EM | Admit: 2019-12-11 | Discharge: 2019-12-11 | Discharge status: Against Medical Advice | Payer: Medicaid Other

## 2020-05-01 ENCOUNTER — Other Ambulatory Visit: Payer: Self-pay

## 2020-05-01 ENCOUNTER — Ambulatory Visit (HOSPITAL_COMMUNITY)
Admission: EM | Admit: 2020-05-01 | Discharge: 2020-05-01 | Disposition: A | Discharge status: Home / Self Care | Payer: Self-pay | Attending: Medical Oncology | Admitting: Medical Oncology

## 2020-05-01 ENCOUNTER — Encounter (HOSPITAL_COMMUNITY): Payer: Self-pay | Admitting: *Deleted

## 2020-05-01 DIAGNOSIS — B360 Pityriasis versicolor: Secondary | ICD-10-CM

## 2020-05-01 MED ORDER — KETOCONAZOLE 2 % EX SHAM: 1.0000 "application " | MEDICATED_SHAMPOO | CUTANEOUS | 0 refills | Status: AC

## 2020-05-01 MED ORDER — FLUCONAZOLE 150 MG PO TABS: 150.0000 mg | ORAL_TABLET | Freq: Every day | ORAL | 0 refills | Status: DC

## 2020-05-01 NOTE — ED Triage Notes (Signed)
Pt reports rash for years and years but worse recently. Pt comes today for treatment of rash .

## 2020-05-01 NOTE — ED Provider Notes (Signed)
[facility]    CSN: [id number] Arrival date & time: 05/01/20  1040      History   Chief Complaint Chief Complaint  Patient presents with  . Rash    HPI [facility] Kline is a 35 y.o. male.   HPI   Rash: Patient reports that he has had a rash of his body for the past year off and on.  The rash is slightly itchy but otherwise causes no discomfort.  The rash is located under his arms, his back, his groin and arms.  He reports that the last time he was seen he was given a medication that started with a D that was for yeast that did help with his symptoms. No history of MRSA, no bleeding or pain.   History reviewed. No pertinent past medical history.  Patient Active Problem List   Diagnosis Date Noted  . TOBACCO USER 03/11/2009  . OBESITY, NOS 05/06/2006    History reviewed. No pertinent surgical history.   Home Medications    Prior to Admission medications   Medication Sig Start Date End Date Taking? Authorizing Provider  clotrimazole (LOTRIMIN) 1 % cream Apply to affected area 2 times daily 05/10/19   Moshe Cipro, NP    Family History Family History  Family history unknown: Yes    Social History Social History   Tobacco Use  . Smoking status: Current Every Day Smoker    Packs/day: 0.33  . Smokeless tobacco: Never Used  Substance Use Topics  . Alcohol use: Yes  . Drug use: No     Allergies   Patient has no known allergies.   Review of Systems Review of Systems  As stated above in HPI Physical Exam Triage Vital Signs ED Triage Vitals  Enc Vitals Group     BP 05/01/20 1051 128/82     Pulse Rate 05/01/20 1051 90     Resp 05/01/20 1051 18     Temp 05/01/20 1051 98.7 F (37.1 C)     Temp Source 05/01/20 1051 Oral     SpO2 05/01/20 1051 100 %     Weight --      Height --      Head Circumference --      Peak Flow --      Pain Score 05/01/20 1048 0     Pain Loc --      Pain Edu? --      Excl. in GC? --    No data  found.  Updated Vital Signs BP 128/82 (BP Location: Right Arm)   Pulse 90   Temp 98.7 F (37.1 C) (Oral)   Resp 18   SpO2 100%   Physical Exam Vitals and nursing note reviewed.  Constitutional:      General: He is not in acute distress.    Appearance: Normal appearance. He is obese. He is not ill-appearing, toxic-appearing or diaphoretic.  Skin:    Comments: Scattered well demarcated patches of slightly hyperpigmented skin with slight scale of back, arms. No evidence of skin breakdown.   Neurological:     Mental Status: He is alert.      UC Treatments / Results  Labs (all labs ordered are listed, but only abnormal results are displayed) Labs Reviewed - No data to display  EKG   Radiology No results found.  Procedures Procedures (including critical care time)  Medications Ordered in UC Medications - No data to display  Initial Impression / Assessment and Plan / UC Course  I have reviewed the triage vital signs and the nursing notes.  Pertinent labs & imaging results that were available during my care of the patient were reviewed by me and considered in my medical decision making (see chart for details).     New. Discussed with patient including ways to help prevent.  They are aware that it can take a few weeks before he notices improvement in the pigment change.  He requests the Diflucan pill in addition to the shampoo given that he does have some rash in the genital area.  He reports that he tolerated the medication well previously.  I have also recommended that he follow-up with a primary care provider to screen for items such as diabetes that may cause him to have continued troubles with tinea versicolor Final Clinical Impressions(s) / UC Diagnoses   Final diagnoses:  None   Discharge Instructions   None    ED Prescriptions    None     PDMP not reviewed this encounter.   Rushie Chestnut, [place] 05/01/20 1110

## 2020-12-28 ENCOUNTER — Emergency Department (HOSPITAL_COMMUNITY)
Admission: EM | Admit: 2020-12-28 | Discharge: 2020-12-28 | Disposition: A | Discharge status: Home / Self Care | Payer: Medicaid Other | Attending: Emergency Medicine | Admitting: Emergency Medicine

## 2020-12-28 ENCOUNTER — Other Ambulatory Visit: Payer: Self-pay

## 2020-12-28 DIAGNOSIS — B3789 Other sites of candidiasis: Secondary | ICD-10-CM

## 2020-12-28 DIAGNOSIS — B3749 Other urogenital candidiasis: Secondary | ICD-10-CM | POA: Insufficient documentation

## 2020-12-28 MED ORDER — FLUCONAZOLE 150 MG PO TABS
150.0000 mg | ORAL_TABLET | Freq: Once | ORAL | Status: AC
  Administered 2020-12-28: 150 mg via ORAL
  Filled 2020-12-28: qty 1

## 2020-12-28 MED ORDER — CLOTRIMAZOLE 1 % EX CREA: TOPICAL_CREAM | CUTANEOUS | 0 refills | Status: DC

## 2020-12-28 NOTE — ED Provider Notes (Signed)
[facility] Provider Note   CSN: [id number] Arrival date & time: 12/28/20  1723     History Chief Complaint  Patient presents with   Rx refill     Larry Kline is a 35 y.o. male hx of obesity, here with possible yeast infection. Patient states that he was concerned that he may have yeast infection.   He states that he works at [employer].  He noticed pain under his pannus and some mild whitish discharge.  Has similar symptoms previously and required Diflucan and clotrimazole cream.  He request another dose of diflucan and clotrimazole.  Denies any fevers.  The history is provided by the patient.      No past medical history on file.  Patient Active Problem List   Diagnosis Date Noted   TOBACCO USER 03/11/2009   OBESITY, NOS 05/06/2006    No past surgical history on file.     Family History  Family history unknown: Yes    Social History   Tobacco Use   Smoking status: Every Day    Packs/day: 0.33    Types: Cigarettes   Smokeless tobacco: Never  Substance Use Topics   Alcohol use: Yes   Drug use: No    Home Medications Prior to Admission medications   Medication Sig Start Date End Date Taking? Authorizing Provider  clotrimazole (LOTRIMIN) 1 % cream Apply to affected area 2 times daily 05/10/19   Larry Cipro, NP  fluconazole (DIFLUCAN) 150 MG tablet Take 1 tablet (150 mg total) by mouth daily. 05/01/20   Larry Chestnut, PA-C    Allergies    Penicillins  Review of Systems   Review of Systems  Skin:  Positive for rash.  All other systems reviewed and are negative.  Physical Exam Updated Vital Signs BP 111/67 (BP Location: Left Arm)   Pulse 60   Temp 98 F (36.7 C)   Resp 18   Ht 6' (1.829 m)   Wt 136.1 kg   SpO2 99%   BMI 40.69 kg/m   Physical Exam Vitals and nursing note reviewed.  HENT:     Head: Normocephalic.     Nose: Nose normal.     Mouth/Throat:     Mouth: Mucous membranes are  moist.  Eyes:     Extraocular Movements: Extraocular movements intact.     Pupils: Pupils are equal, round, and reactive to light.  Cardiovascular:     Rate and Rhythm: Normal rate.     Pulses: Normal pulses.  Pulmonary:     Effort: Pulmonary effort is normal.     Breath sounds: Normal breath sounds.  Abdominal:     General: Abdomen is flat.     Palpations: Abdomen is soft.     Comments: Patient has a large pannus.  Underneath the pannus, there are a couple vesicles.  No obvious purulent discharge.   Musculoskeletal:        General: Normal range of motion.     Cervical back: Normal range of motion.  Skin:    General: Skin is warm.     Capillary Refill: Capillary refill takes less than 2 seconds.  Neurological:     General: No focal deficit present.     Mental Status: He is alert.  Psychiatric:        Mood and Affect: Mood normal.    ED Results / Procedures / Treatments   Labs (all labs ordered are listed, but only abnormal results are displayed)  Labs Reviewed - No data to display  EKG None  Radiology No results found.  Procedures Procedures   Medications Ordered in ED Medications  fluconazole (DIFLUCAN) tablet 150 mg (has no administration in time range)    ED Course  I have reviewed the triage vital signs and the nursing notes.  Pertinent labs & imaging results that were available during my care of the patient were reviewed by me and considered in my medical decision making (see chart for details).    MDM Rules/Calculators/A&P                           Larry Kline is a 35 y.o. male here with possible yeast infection.  Patient has a large pannus.  There is a couple vesicles.  There is no obvious purulent discharge.  Patient received a dose of Diflucan.  We will try clotrimazole cream again. Gave strict return precautions   Final Clinical Impression(s) / ED Diagnoses Final diagnoses:  None    Rx / DC Orders ED Discharge Orders     None         Larry Pander, MD 12/28/20 1756

## 2020-12-28 NOTE — ED Triage Notes (Signed)
Pt states that he needs a prescription refilled that was used for yeast that he had on his body. Does not recall name of medication. Pt states yeast is underbelly and on sides.

## 2020-12-28 NOTE — ED Notes (Signed)
Patient verbalizes understanding of discharge instructions. Prescriptions reviewed. Opportunity for questioning and answers were provided. Armband removed by staff, pt discharged from ED ambulatory. ° °

## 2020-12-28 NOTE — Discharge Instructions (Signed)
Use Lotrimin cream as prescribed  See your doctor for follow-up  Return to ER if you have worse rash, severe pain, purulent discharge, fever

## 2021-03-03 ENCOUNTER — Emergency Department (HOSPITAL_COMMUNITY): Payer: Self-pay

## 2021-03-03 ENCOUNTER — Encounter (HOSPITAL_COMMUNITY): Payer: Self-pay | Admitting: *Deleted

## 2021-03-03 ENCOUNTER — Emergency Department (HOSPITAL_COMMUNITY)
Admission: EM | Admit: 2021-03-03 | Discharge: 2021-03-03 | Disposition: A | Discharge status: Home / Self Care | Payer: Medicaid Other | Attending: Emergency Medicine | Admitting: Emergency Medicine

## 2021-03-03 ENCOUNTER — Emergency Department (HOSPITAL_COMMUNITY)
Admission: EM | Admit: 2021-03-03 | Discharge: 2021-03-03 | Disposition: A | Discharge status: Home / Self Care | Payer: Self-pay | Attending: Emergency Medicine | Admitting: Emergency Medicine

## 2021-03-03 ENCOUNTER — Other Ambulatory Visit: Payer: Self-pay

## 2021-03-03 DIAGNOSIS — Z5321 Procedure and treatment not carried out due to patient leaving prior to being seen by health care provider: Secondary | ICD-10-CM | POA: Insufficient documentation

## 2021-03-03 DIAGNOSIS — R509 Fever, unspecified: Secondary | ICD-10-CM | POA: Insufficient documentation

## 2021-03-03 DIAGNOSIS — M542 Cervicalgia: Secondary | ICD-10-CM | POA: Insufficient documentation

## 2021-03-03 DIAGNOSIS — Z20822 Contact with and (suspected) exposure to covid-19: Secondary | ICD-10-CM | POA: Insufficient documentation

## 2021-03-03 DIAGNOSIS — R519 Headache, unspecified: Secondary | ICD-10-CM | POA: Insufficient documentation

## 2021-03-03 DIAGNOSIS — M791 Myalgia, unspecified site: Secondary | ICD-10-CM | POA: Insufficient documentation

## 2021-03-03 DIAGNOSIS — R6883 Chills (without fever): Secondary | ICD-10-CM | POA: Insufficient documentation

## 2021-03-03 LAB — COMPREHENSIVE METABOLIC PANEL
ALT: 48 U/L — ABNORMAL HIGH (ref 0–44)
AST: 35 U/L (ref 15–41)
Albumin: 3.6 g/dL (ref 3.5–5.0)
Alkaline Phosphatase: 61 U/L (ref 38–126)
Anion gap: 11 (ref 5–15)
BUN: 10 mg/dL (ref 6–20)
CO2: 25 mmol/L (ref 22–32)
Calcium: 8.7 mg/dL — ABNORMAL LOW (ref 8.9–10.3)
Chloride: 97 mmol/L — ABNORMAL LOW (ref 98–111)
Creatinine, Ser: 1.25 mg/dL — ABNORMAL HIGH (ref 0.61–1.24)
GFR, Estimated: >60 mL/min (ref >60)
Glucose, Bld: 147 mg/dL — ABNORMAL HIGH (ref 70–99)
Potassium: 4.2 mmol/L (ref 3.5–5.1)
Sodium: 133 mmol/L — ABNORMAL LOW (ref 135–145)
Total Bilirubin: 1 mg/dL (ref 0.3–1.2)
Total Protein: 8.1 g/dL (ref 6.5–8.1)

## 2021-03-03 LAB — CBC WITH DIFFERENTIAL/PLATELET
Abs Immature Granulocytes: 0.07 10*3/uL (ref 0.00–0.07)
Basophils Absolute: 0 10*3/uL (ref 0.0–0.1)
Basophils Relative: 0 %
Eosinophils Absolute: 0 10*3/uL (ref 0.0–0.5)
Eosinophils Relative: 0 %
HCT: 45.3 % (ref 39.0–52.0)
Hemoglobin: 15.2 g/dL (ref 13.0–17.0)
Immature Granulocytes: 1 %
Lymphocytes Relative: 12 %
Lymphs Abs: 1.3 10*3/uL (ref 0.7–4.0)
MCH: 28.1 pg (ref 26.0–34.0)
MCHC: 33.6 g/dL (ref 30.0–36.0)
MCV: 83.9 fL (ref 80.0–100.0)
Monocytes Absolute: 1.6 10*3/uL — ABNORMAL HIGH (ref 0.1–1.0)
Monocytes Relative: 14 %
Neutro Abs: 8.5 10*3/uL — ABNORMAL HIGH (ref 1.7–7.7)
Neutrophils Relative %: 73 %
Platelets: 179 10*3/uL (ref 150–400)
RBC: 5.4 MIL/uL (ref 4.22–5.81)
RDW: 14.9 % (ref 11.5–15.5)
WBC: 11.5 10*3/uL — ABNORMAL HIGH (ref 4.0–10.5)
nRBC: 0 % (ref 0.0–0.2)

## 2021-03-03 LAB — URINALYSIS, ROUTINE W REFLEX MICROSCOPIC
Glucose, UA: NEGATIVE mg/dL
Ketones, ur: NEGATIVE mg/dL
Leukocytes,Ua: NEGATIVE
Nitrite: NEGATIVE
Protein, ur: 100 mg/dL — AB
Specific Gravity, Urine: >1.03 — ABNORMAL HIGH (ref 1.005–1.030)
pH: 5.5 (ref 5.0–8.0)

## 2021-03-03 LAB — RESP PANEL BY RT-PCR (FLU A&B, COVID) ARPGX2
Influenza A by PCR: NEGATIVE
Influenza B by PCR: NEGATIVE
SARS Coronavirus 2 by RT PCR: NEGATIVE

## 2021-03-03 LAB — URINALYSIS, MICROSCOPIC (REFLEX)
Bacteria, UA: NONE SEEN
Squamous Epithelial / HPF: NONE SEEN (ref 0–5)

## 2021-03-03 LAB — LIPASE, BLOOD: Lipase: 26 U/L (ref 11–51)

## 2021-03-03 MED ORDER — ACETAMINOPHEN 500 MG PO TABS
1000.0000 mg | ORAL_TABLET | Freq: Once | ORAL | Status: AC
  Administered 2021-03-03: 13:00:00 1000 mg via ORAL
  Filled 2021-03-03: qty 2

## 2021-03-03 MED ORDER — ONDANSETRON 4 MG PO TBDP
4.0000 mg | ORAL_TABLET | Freq: Once | ORAL | Status: AC
  Administered 2021-03-03: 13:00:00 4 mg via ORAL
  Filled 2021-03-03: qty 1

## 2021-03-03 MED ORDER — ACETAMINOPHEN 325 MG PO TABS
650.0000 mg | ORAL_TABLET | Freq: Once | ORAL | Status: AC
  Administered 2021-03-03: 20:00:00 650 mg via ORAL
  Filled 2021-03-03: qty 2

## 2021-03-03 NOTE — ED Provider Notes (Signed)
Emergency Medicine Provider Triage Evaluation Note  [facility] Kline , a 35 y.o. male  was evaluated in triage.  Pt complains of headache, neck pain, chills, fever, body aches for the last 4 days.  Patient also endorses some occasional nausea without vomiting.  Patient denies diarrhea.  Patient denies cough, sore throat.  Patient denies recent sick contacts.  Patient denies flu shot this year.  Patient has not tried anything for his pain.  Patient reports some occasional throbbing chest pressure, as well as shortness of breath, without wheezing.  Review of Systems  Positive: As above Negative: As above  Physical Exam  BP 133/86 (BP Location: Left Arm)    Pulse (!) 102    Temp (!) 103.2 F (39.6 C) (Oral)    Resp 20  Gen:   Awake, no distress   Resp:  Normal effort  MSK:   Moves extremities without difficulty  Other:  Shivering, vague generalized ttp abdomen, no focal tenderness  Medical Decision Making  Medically screening exam initiated at 12:36 PM.  Appropriate orders placed.  Antero Derosia was informed that the remainder of the evaluation will be completed by another provider, this initial triage assessment does not replace that evaluation, and the importance of remaining in the ED until their evaluation is complete.  Flu-like sx, workup initiated   Olene Floss, PA-C 03/03/21 1237    Melene Plan, DO 03/03/21 1327

## 2021-03-03 NOTE — ED Notes (Signed)
Pt states that they are leaving  °

## 2021-03-03 NOTE — ED Provider Notes (Signed)
Emergency Medicine Provider Triage Evaluation Note    [facility]ry Kline , a 35 y.o. male  was evaluated in triage.  Pt complains of headache, neck pain, chills, fever, body aches for the last 4 days.  Patient also endorses some occasional nausea without vomiting.  Patient denies diarrhea.  Patient denies cough, sore throat.  Patient denies recent sick contacts.  Patient denies flu shot this year.  Patient has not tried anything for his pain.  Patient reports some occasional throbbing chest pressure, as well as shortness of breath, without wheezing.  Patient LWBS earlier today. Is back with same sx. Negative Covid flu since last eval. Ongoing nausea, worsening fever. Some abdominal cramping.  Review of Systems  Positive: As above Negative: As above  Physical Exam  BP 134/88 (BP Location: Right Arm)    Pulse 98    Temp (!) 102.5 F (39.2 C) (Oral)    Resp 20    Ht 5\' 11"  (1.803 m)    Wt (!) 136.1 kg    SpO2 99%    BMI 41.85 kg/m  Gen:   Awake, no distress   Resp:  Normal effort  MSK:   Moves extremities without difficulty  Other:  Some shivering, vague generalized ttp abdomen, no focal tenderness  Medical Decision Making  Medically screening exam initiated at 7:50 PM.  Appropriate orders placed.  Montre [place] was informed that the remainder of the evaluation will be completed by another provider, this initial triage assessment does not replace that evaluation, and the importance of remaining in the ED until their evaluation is complete.  Discussed with patient I do recommend further evaluation Will obtain abdominal labs     Corwin Levins 03/03/21 1952    03/05/21, MD 03/03/21 2318

## 2021-03-03 NOTE — ED Notes (Signed)
Patient states he is leaving. 

## 2021-03-03 NOTE — ED Triage Notes (Signed)
Pt reports bodyaches, chills, neck pain, headache for several days.

## 2021-03-03 NOTE — ED Triage Notes (Signed)
Pt c/o headache, chills, and fever. Pt was seen here earlier today for same.

## 2021-03-04 ENCOUNTER — Encounter (HOSPITAL_COMMUNITY): Payer: Self-pay

## 2021-03-04 ENCOUNTER — Emergency Department (HOSPITAL_COMMUNITY): Payer: Self-pay

## 2021-03-04 ENCOUNTER — Emergency Department (HOSPITAL_COMMUNITY)
Admission: EM | Admit: 2021-03-04 | Discharge: 2021-03-04 | Disposition: A | Discharge status: Home / Self Care | Payer: Self-pay | Attending: Emergency Medicine | Admitting: Emergency Medicine

## 2021-03-04 ENCOUNTER — Other Ambulatory Visit: Payer: Self-pay

## 2021-03-04 DIAGNOSIS — J189 Pneumonia, unspecified organism: Secondary | ICD-10-CM

## 2021-03-04 DIAGNOSIS — F17210 Nicotine dependence, cigarettes, uncomplicated: Secondary | ICD-10-CM | POA: Insufficient documentation

## 2021-03-04 DIAGNOSIS — R0602 Shortness of breath: Secondary | ICD-10-CM | POA: Insufficient documentation

## 2021-03-04 DIAGNOSIS — J181 Lobar pneumonia, unspecified organism: Secondary | ICD-10-CM | POA: Insufficient documentation

## 2021-03-04 LAB — URINALYSIS, MICROSCOPIC (REFLEX): Squamous Epithelial / HPF: NONE SEEN (ref 0–5)

## 2021-03-04 LAB — CBC WITH DIFFERENTIAL/PLATELET
Abs Immature Granulocytes: 0.07 10*3/uL (ref 0.00–0.07)
Basophils Absolute: 0 10*3/uL (ref 0.0–0.1)
Basophils Relative: 0 %
Eosinophils Absolute: 0 10*3/uL (ref 0.0–0.5)
Eosinophils Relative: 0 %
HCT: 43.4 % (ref 39.0–52.0)
Hemoglobin: 14.8 g/dL (ref 13.0–17.0)
Immature Granulocytes: 1 %
Lymphocytes Relative: 9 %
Lymphs Abs: 1 10*3/uL (ref 0.7–4.0)
MCH: 28.2 pg (ref 26.0–34.0)
MCHC: 34.1 g/dL (ref 30.0–36.0)
MCV: 82.8 fL (ref 80.0–100.0)
Monocytes Absolute: 1.2 10*3/uL — ABNORMAL HIGH (ref 0.1–1.0)
Monocytes Relative: 12 %
Neutro Abs: 8.5 10*3/uL — ABNORMAL HIGH (ref 1.7–7.7)
Neutrophils Relative %: 78 %
Platelets: 163 10*3/uL (ref 150–400)
RBC: 5.24 MIL/uL (ref 4.22–5.81)
RDW: 14.8 % (ref 11.5–15.5)
WBC: 10.8 10*3/uL — ABNORMAL HIGH (ref 4.0–10.5)
nRBC: 0 % (ref 0.0–0.2)

## 2021-03-04 LAB — COMPREHENSIVE METABOLIC PANEL
ALT: 52 U/L — ABNORMAL HIGH (ref 0–44)
AST: 37 U/L (ref 15–41)
Albumin: 3.3 g/dL — ABNORMAL LOW (ref 3.5–5.0)
Alkaline Phosphatase: 60 U/L (ref 38–126)
Anion gap: 12 (ref 5–15)
BUN: 8 mg/dL (ref 6–20)
CO2: 25 mmol/L (ref 22–32)
Calcium: 8.4 mg/dL — ABNORMAL LOW (ref 8.9–10.3)
Chloride: 92 mmol/L — ABNORMAL LOW (ref 98–111)
Creatinine, Ser: 1.4 mg/dL — ABNORMAL HIGH (ref 0.61–1.24)
GFR, Estimated: >60 mL/min (ref >60)
Glucose, Bld: 165 mg/dL — ABNORMAL HIGH (ref 70–99)
Potassium: 3.8 mmol/L (ref 3.5–5.1)
Sodium: 129 mmol/L — ABNORMAL LOW (ref 135–145)
Total Bilirubin: 0.7 mg/dL (ref 0.3–1.2)
Total Protein: 7.1 g/dL (ref 6.5–8.1)

## 2021-03-04 LAB — URINALYSIS, ROUTINE W REFLEX MICROSCOPIC
Glucose, UA: NEGATIVE mg/dL
Ketones, ur: NEGATIVE mg/dL
Leukocytes,Ua: NEGATIVE
Nitrite: NEGATIVE
Protein, ur: 100 mg/dL — AB
Specific Gravity, Urine: 1.025 (ref 1.005–1.030)
pH: 5.5 (ref 5.0–8.0)

## 2021-03-04 LAB — PROTIME-INR
INR: 1.1 (ref 0.8–1.2)
Prothrombin Time: 14.5 seconds (ref 11.4–15.2)

## 2021-03-04 LAB — LACTIC ACID, PLASMA: Lactic Acid, Venous: 1.8 mmol/L (ref 0.5–1.9)

## 2021-03-04 MED ORDER — SODIUM CHLORIDE 0.9 % IV BOLUS
1000.0000 mL | Freq: Once | INTRAVENOUS | Status: AC
  Administered 2021-03-04: 16:00:00 1000 mL via INTRAVENOUS

## 2021-03-04 MED ORDER — ACETAMINOPHEN 500 MG PO TABS
1000.0000 mg | ORAL_TABLET | Freq: Once | ORAL | Status: AC
  Administered 2021-03-04: 17:00:00 1000 mg via ORAL
  Filled 2021-03-04: qty 2

## 2021-03-04 MED ORDER — AZITHROMYCIN 250 MG PO TABS: 250.0000 mg | ORAL_TABLET | Freq: Every day | ORAL | 0 refills | Status: DC

## 2021-03-04 MED ORDER — IBUPROFEN 800 MG PO TABS
800.0000 mg | ORAL_TABLET | Freq: Once | ORAL | Status: AC
  Administered 2021-03-04: 07:00:00 800 mg via ORAL
  Filled 2021-03-04: qty 1

## 2021-03-04 MED ORDER — CEFPODOXIME PROXETIL 200 MG PO TABS: 200.0000 mg | ORAL_TABLET | Freq: Two times a day (BID) | ORAL | 0 refills | Status: AC

## 2021-03-04 MED ORDER — KETOROLAC TROMETHAMINE 15 MG/ML IJ SOLN
15.0000 mg | Freq: Once | INTRAMUSCULAR | Status: AC
  Administered 2021-03-04: 16:00:00 15 mg via INTRAVENOUS
  Filled 2021-03-04: qty 1

## 2021-03-04 MED ORDER — AZITHROMYCIN 250 MG PO TABS: 500.0000 mg | ORAL_TABLET | Freq: Every day | ORAL | Status: DC

## 2021-03-04 MED ORDER — ONDANSETRON HCL 4 MG PO TABS: 4.0000 mg | ORAL_TABLET | Freq: Four times a day (QID) | ORAL | 0 refills | Status: DC

## 2021-03-04 NOTE — Discharge Instructions (Addendum)
You were seen in the emergency department today for fever and headache.  As we discussed based on your lab work and your chest x-ray, we want to treat you for pneumonia.  We normally treat these with antibiotics, we have given you one dose here in the emergency department.    You will be taking 2 different antibiotics, one is Azithromycin, which you will take 2 tablets today, and then 1 tablet every day until finished.  The other is called Vantin, and you will take 1 tablet by mouth twice daily until finished.  It is important that you finish the entire course of both.  I am also prescribing you zofran, which is a nausea medicine you can take every 6 hours as needed.   Please use Tylenol or ibuprofen for pain.  You may use 600 mg ibuprofen every 6 hours or 1000 mg of Tylenol every 6 hours.  You may choose to alternate between the 2.  This would be most effective.  Do not exceed 4 g of Tylenol within 24 hours.  Do not exceed 3200 mg ibuprofen within 24 hours.   Continue to monitor how you're doing and return to the ER for new or worsening symptoms such as fever despite medication, difficulty breathing.   It has been a pleasure seeing and caring for you today and I hope you start feeling better soon!

## 2021-03-04 NOTE — ED Provider Notes (Signed)
[facility] EMERGENCY DEPARTMENT Provider Note   CSN: [id number] Arrival date & time: 03/04/21  7096     History Chief Complaint  Patient presents with   Fever   Headache    [facility]line is a 35 y.o. male with no significant past medical history presents the emergency department complaining of headache, chills, and fever for 4 days.  Patient reports occasional nausea without vomiting, and one episode of diarrhea yesterday.  Patient initially reported chest pain, made worse with coughing and deep breathing, and associated shortness of breath. Patient left without being seen twice earlier today, and is back with the same symptoms.  Reports ongoing nausea and worsening fever, with abdominal cramping. Patient denies any chronic medical problems, no history of blood clots.   Fever Associated symptoms: chest pain, chills, congestion, headaches and nausea   Associated symptoms: no cough, no diarrhea, no dysuria, no sore throat and no vomiting   Headache Associated symptoms: congestion, dizziness, fever, nausea and neck pain   Associated symptoms: no abdominal pain, no cough, no diarrhea, no numbness, no sore throat, no vomiting and no weakness       History reviewed. No pertinent past medical history.  Patient Active Problem List   Diagnosis Date Noted   TOBACCO USER 03/11/2009   OBESITY, NOS 05/06/2006    History reviewed. No pertinent surgical history.     Family History  Family history unknown: Yes    Social History   Tobacco Use   Smoking status: Every Day    Packs/day: 0.33    Types: Cigarettes   Smokeless tobacco: Never  Substance Use Topics   Alcohol use: Yes   Drug use: No    Home Medications Prior to Admission medications   Medication Sig Start Date End Date Taking? Authorizing Provider  azithromycin (ZITHROMAX) 250 MG tablet Take 1 tablet (250 mg total) by mouth daily. Take first 2 tablets together, then 1 every day until finished.  03/04/21  Yes Tharon Kitch T, PA-C  cefpodoxime (VANTIN) 200 MG tablet Take 1 tablet (200 mg total) by mouth 2 (two) times daily for 5 days. 03/04/21 03/09/21 Yes Finn Amos T, PA-C  ondansetron (ZOFRAN) 4 MG tablet Take 1 tablet (4 mg total) by mouth every 6 (six) hours. 03/04/21  Yes Ellyana Crigler T, PA-C  clotrimazole (LOTRIMIN) 1 % cream Apply to affected area 2 times daily 12/28/20   Charlynne Pander, MD  fluconazole (DIFLUCAN) 150 MG tablet Take 1 tablet (150 mg total) by mouth daily. 05/01/20   Rushie Chestnut, PA-C    Allergies    Penicillins  Review of Systems   Review of Systems  Constitutional:  Positive for appetite change, chills and fever.  HENT:  Positive for congestion. Negative for sore throat and trouble swallowing.   Respiratory:  Positive for shortness of breath. Negative for cough and wheezing.   Cardiovascular:  Positive for chest pain. Negative for palpitations and leg swelling.  Gastrointestinal:  Positive for nausea. Negative for abdominal pain, constipation, diarrhea and vomiting.  Genitourinary:  Negative for dysuria and flank pain.  Musculoskeletal:  Positive for neck pain.  Neurological:  Positive for dizziness and headaches. Negative for syncope, weakness and numbness.  All other systems reviewed and are negative.  Physical Exam Updated Vital Signs BP 138/70 (BP Location: Left Arm)    Pulse (!) 111    Temp (!) 103 F (39.4 C) (Oral)    Resp 18    Ht 5\' 11"  (1.803 m)  Wt (!) 137 kg    SpO2 93%    BMI 42.12 kg/m   Physical Exam Vitals and nursing note reviewed.  Constitutional:      Appearance: Normal appearance.  HENT:     Head: Normocephalic and atraumatic.     Nose: Congestion present.     Mouth/Throat:     Mouth: Mucous membranes are moist.     Pharynx: Oropharynx is clear.  Eyes:     Extraocular Movements: Extraocular movements intact.     Conjunctiva/sclera: Conjunctivae normal.     Pupils: Pupils are equal, round, and  reactive to light.  Cardiovascular:     Rate and Rhythm: Normal rate and regular rhythm.  Pulmonary:     Effort: Pulmonary effort is normal. No respiratory distress.     Breath sounds: Examination of the right-middle field reveals rhonchi. Examination of the right-lower field reveals rhonchi. Rhonchi present. No decreased breath sounds or wheezing.  Abdominal:     General: There is no distension.     Palpations: Abdomen is soft.     Tenderness: There is no abdominal tenderness. There is no guarding or rebound.  Musculoskeletal:        General: Normal range of motion.     Right lower leg: No edema.     Left lower leg: No edema.  Skin:    General: Skin is warm and dry.     Comments: Patient appears flushed and skin hot to touch  Neurological:     General: No focal deficit present.     Mental Status: He is alert and oriented to person, place, and time.     Comments: Neuro: Speech is clear, able to follow commands. CN III-XII intact grossly intact. PERRLA. EOMI. Sensation intact throughout. Str 5/5 all extremities. No nuchal rigidity. Negative Kernig's and Brudzinski's sign. Negative Jolt test.     ED Results / Procedures / Treatments   Labs (all labs ordered are listed, but only abnormal results are displayed) Labs Reviewed  COMPREHENSIVE METABOLIC PANEL - Abnormal; Notable for the following components:      Result Value   Sodium 129 (*)    Chloride 92 (*)    Glucose, Bld 165 (*)    Creatinine, Ser 1.40 (*)    Calcium 8.4 (*)    Albumin 3.3 (*)    ALT 52 (*)    All other components within normal limits  CBC WITH DIFFERENTIAL/PLATELET - Abnormal; Notable for the following components:   WBC 10.8 (*)    Neutro Abs 8.5 (*)    Monocytes Absolute 1.2 (*)    All other components within normal limits  URINALYSIS, ROUTINE W REFLEX MICROSCOPIC - Abnormal; Notable for the following components:   Hgb urine dipstick MODERATE (*)    Bilirubin Urine SMALL (*)    Protein, ur 100 (*)    All  other components within normal limits  URINALYSIS, MICROSCOPIC (REFLEX) - Abnormal; Notable for the following components:   Bacteria, UA RARE (*)    All other components within normal limits  CULTURE, BLOOD (ROUTINE X 2)  CULTURE, BLOOD (ROUTINE X 2)  LACTIC ACID, PLASMA  PROTIME-INR    EKG None  Radiology DG Chest 2 View  Result Date: 03/04/2021 CLINICAL DATA:  35 year old male with fever, cough and shortness of breath for the past 2 days. EXAM: CHEST - 2 VIEW COMPARISON:  Chest x-ray 03/03/2021. FINDINGS: Extensive airspace consolidation again noted in the posterior aspect of the right lower lobe. Subsegmental atelectasis in  the periphery of the left lung base. No pneumothorax. No evidence of pulmonary edema. No pleural effusions. Heart size is normal. Upper mediastinal contours are within normal limits. IMPRESSION: 1. Persistent right lower lobe pneumonia. Electronically Signed   By: Trudie Reed M.D.   On: 03/04/2021 07:40   DG Chest 2 View  Result Date: 03/03/2021 CLINICAL DATA:  Shortness of breath. EXAM: CHEST - 2 VIEW COMPARISON:  None. FINDINGS: Cardiac size is within normal limits. There are no signs of pulmonary edema. There is patchy infiltrate in the posteromedial right lower lung fields. There is no significant pleural effusion or pneumothorax. IMPRESSION: There is patchy infiltrate in the posteromedial right lower lung fields suggesting pneumonia. There is no pleural effusion. Electronically Signed   By: Ernie Avena M.D.   On: 03/03/2021 13:25    Procedures Procedures   Medications Ordered in ED Medications  ibuprofen (ADVIL) tablet 800 mg (800 mg Oral Given 03/04/21 0651)  sodium chloride 0.9 % bolus 1,000 mL (0 mLs Intravenous Stopped 03/04/21 1646)  ketorolac (TORADOL) 15 MG/ML injection 15 mg (15 mg Intravenous Given 03/04/21 1547)  acetaminophen (TYLENOL) tablet 1,000 mg (1,000 mg Oral Given 03/04/21 1653)    ED Course  I have reviewed the triage  vital signs and the nursing notes.  Pertinent labs & imaging results that were available during my care of the patient were reviewed by me and considered in my medical decision making (see chart for details).    MDM Rules/Calculators/A&P                          Patient is an otherwise healthy 35 year old male who presents to the emergency department complaining of 4 days of fever, headache, and nonproductive cough.  Patient previously presented to the emergency department, and had basic labs ordered, but left before being seen due to wait time.  He states he continues to have fever, neck pain, nausea.  On my exam patient is febrile to 104.25F, tachycardic to 111, not hypoxic.  He appears uncomfortable, and skin is very warm to the touch.  Lung sounds are clear to auscultation in all fields.  X-ray performed today compared to prior which shows consistent right lower lobe filtrate suggestive of pneumonia.  COVID and flu test yesterday was negative, so was not repeated today.  Patient is full passive ROM of his neck, and has no meningeal signs.  He has no clinical signs or symptoms of a DVT, no history of blood clots.  I suspect his tachycardia is most likely related to his fever, patient given IV fluids, Toradol and Tylenol.  Symptoms and vital signs improved after this.  I do not think patient requires admission or inpatient treatment for his symptoms.  Curb 65 pneumonia admission calculator used, with a score of 0.  Plan to discharged home, with prescription for antibiotics.  Patient given close return precautions.  Patient agreeable to plan.  I discussed this case with my attending physician Dr. Dalene Seltzer who cosigned this note including patient's presenting symptoms, physical exam, and planned diagnostics and interventions. Attending physician stated agreement with plan or made changes to plan which were implemented.    Final Clinical Impression(s) / ED Diagnoses Final diagnoses:  Community  acquired pneumonia of right lower lobe of lung    Rx / DC Orders ED Discharge Orders          Ordered    cefpodoxime (VANTIN) 200 MG tablet  2 times daily  03/04/21 1736    azithromycin (ZITHROMAX) 250 MG tablet  Daily        03/04/21 1736    ondansetron (ZOFRAN) 4 MG tablet  Every 6 hours        03/04/21 1736           Portions of this report may have been transcribed using voice recognition software. Every effort was made to ensure accuracy; however, inadvertent computerized transcription errors may be present.    Jeanella Flattery 03/04/21 1744    Alvira Monday, MD 03/05/21 [phone]

## 2021-03-04 NOTE — ED Triage Notes (Signed)
Pt arrives POV for eval of headache/chills/fever. 3rd visit in 24 hours for same, but has left twice due to wait time. Pt reports he believes they found PNA. Neg swab on first visit for covid/flu. Last tylenol 1000mg  0530 this AM

## 2021-03-05 ENCOUNTER — Telehealth (HOSPITAL_BASED_OUTPATIENT_CLINIC_OR_DEPARTMENT_OTHER): Payer: Self-pay | Admitting: Emergency Medicine

## 2021-03-05 LAB — BLOOD CULTURE ID PANEL (REFLEXED) - BCID2

## 2021-03-05 NOTE — Telephone Encounter (Signed)
Received call from [facility] with positive blood culture results. Consulted with Dr. Jacqulyn Bath who advised pt was treated appropriately with antibiotics and no further follow up needed.

## 2021-03-07 LAB — CULTURE, BLOOD (ROUTINE X 2): Special Requests: ADEQUATE

## 2021-03-08 ENCOUNTER — Telehealth: Payer: Self-pay | Admitting: Emergency Medicine

## 2021-03-08 NOTE — Progress Notes (Signed)
ED Antimicrobial Stewardship Positive Culture Follow Up   Larry Kline is an 35 y.o. male who presented to [facility] on 03/04/2021 with a chief complaint of  Chief Complaint  Patient presents with   Fever   Headache    Recent Results (from the past 720 hour(s))  Resp Panel by RT-PCR (Flu A&B, Covid) Nasopharyngeal Swab     Status: None   Collection Time: 03/03/21 12:34 PM   Specimen: Nasopharyngeal Swab; Nasopharyngeal(NP) swabs in vial transport medium  Result Value Ref Range Status   SARS Coronavirus 2 by RT PCR NEGATIVE NEGATIVE Final    Comment: (NOTE) SARS-CoV-2 target nucleic acids are NOT DETECTED.  The SARS-CoV-2 RNA is generally detectable in upper respiratory specimens during the acute phase of infection. The lowest concentration of SARS-CoV-2 viral copies this assay can detect is 138 copies/mL. A negative result does not preclude SARS-Cov-2 infection and should not be used as the sole basis for treatment or other patient management decisions. A negative result may occur with  improper specimen collection/handling, submission of specimen other than nasopharyngeal swab, presence of viral mutation(s) within the areas targeted by this assay, and inadequate number of viral copies(<138 copies/mL). A negative result must be combined with clinical observations, patient history, and epidemiological information. The expected result is Negative.  Fact Sheet for Patients:  BloggerCourse.com  Fact Sheet for Healthcare Providers:  SeriousBroker.it  This test is no t yet approved or cleared by the [place] FDA and  has been authorized for detection and/or diagnosis of SARS-CoV-2 by FDA under an Emergency Use Authorization (EUA). This EUA will remain  in effect (meaning this test can be used) for the duration of the COVID-19 declaration under Section 564(b)(1) of the Act, 21 U.S.C.section 360bbb-3(b)(1), unless the  authorization is terminated  or revoked sooner.       Influenza A by PCR NEGATIVE NEGATIVE Final   Influenza B by PCR NEGATIVE NEGATIVE Final    Comment: (NOTE) The Xpert Xpress SARS-CoV-2/FLU/RSV plus assay is intended as an aid in the diagnosis of influenza from Nasopharyngeal swab specimens and should not be used as a sole basis for treatment. Nasal washings and aspirates are unacceptable for Xpert Xpress SARS-CoV-2/FLU/RSV testing.  Fact Sheet for Patients: BloggerCourse.com  Fact Sheet for Healthcare Providers: SeriousBroker.it  This test is not yet approved or cleared by the [place] FDA and has been authorized for detection and/or diagnosis of SARS-CoV-2 by FDA under an Emergency Use Authorization (EUA). This EUA will remain in effect (meaning this test can be used) for the duration of the COVID-19 declaration under Section 564(b)(1) of the Act, 21 U.S.C. section 360bbb-3(b)(1), unless the authorization is terminated or revoked.  Performed at [facility], [address], [place], [place] [address]   Culture, blood (Routine x 2)     Status: None (Preliminary result)   Collection Time: 03/04/21  6:45 AM   Specimen: BLOOD LEFT ARM  Result Value Ref Range Status   Specimen Description BLOOD LEFT ARM  Final   Special Requests   Final    BOTTLES DRAWN AEROBIC AND ANAEROBIC Blood Culture adequate volume   Culture   Final    NO GROWTH 3 DAYS Performed at [facility], [address], [place], [place] [address]    Report Status PENDING  Incomplete  Culture, blood (Routine x 2)     Status: Abnormal   Collection Time: 03/04/21  7:06 AM   Specimen: BLOOD RIGHT ARM  Result  Value Ref Range Status   Specimen Description BLOOD RIGHT ARM  Final   Special Requests   Final    BOTTLES DRAWN AEROBIC ONLY Blood Culture adequate volume   Culture  Setup Time   Final    GRAM POSITIVE COCCI AEROBIC BOTTLE  ONLY CRITICAL RESULT CALLED TO, READ BACK BY AND VERIFIED WITH: K NEAL,RN@0457  03/05/21 MK    Culture (A)  Final    STAPHYLOCOCCUS HOMINIS THE SIGNIFICANCE OF ISOLATING THIS ORGANISM FROM A SINGLE SET OF BLOOD CULTURES WHEN MULTIPLE SETS ARE DRAWN IS UNCERTAIN. PLEASE NOTIFY THE MICROBIOLOGY DEPARTMENT WITHIN ONE WEEK IF SPECIATION AND SENSITIVITIES ARE REQUIRED. Performed at [facility], [address], [place], [place] [address]    Report Status 03/07/2021 FINAL  Final  Blood Culture ID Panel (Reflexed)     Status: Abnormal   Collection Time: 03/04/21  7:06 AM  Result Value Ref Range Status   Enterococcus faecalis NOT DETECTED NOT DETECTED Final   Enterococcus Faecium NOT DETECTED NOT DETECTED Final   Listeria monocytogenes NOT DETECTED NOT DETECTED Final   Staphylococcus species DETECTED (A) NOT DETECTED Final    Comment: CRITICAL RESULT CALLED TO, READ BACK BY AND VERIFIED WITH: K NEAL,RN@0458  03/05/21 MK    Staphylococcus aureus (BCID) NOT DETECTED NOT DETECTED Final   Staphylococcus epidermidis NOT DETECTED NOT DETECTED Final   Staphylococcus lugdunensis NOT DETECTED NOT DETECTED Final   Streptococcus species NOT DETECTED NOT DETECTED Final   Streptococcus agalactiae NOT DETECTED NOT DETECTED Final   Streptococcus pneumoniae NOT DETECTED NOT DETECTED Final   Streptococcus pyogenes NOT DETECTED NOT DETECTED Final   A.calcoaceticus-baumannii NOT DETECTED NOT DETECTED Final   Bacteroides fragilis NOT DETECTED NOT DETECTED Final   Enterobacterales NOT DETECTED NOT DETECTED Final   Enterobacter cloacae complex NOT DETECTED NOT DETECTED Final   Escherichia coli NOT DETECTED NOT DETECTED Final   Klebsiella aerogenes NOT DETECTED NOT DETECTED Final   Klebsiella oxytoca NOT DETECTED NOT DETECTED Final   Klebsiella pneumoniae NOT DETECTED NOT DETECTED Final   Proteus species NOT DETECTED NOT DETECTED Final   Salmonella species NOT DETECTED NOT DETECTED Final   Serratia  marcescens NOT DETECTED NOT DETECTED Final   Haemophilus influenzae NOT DETECTED NOT DETECTED Final   Neisseria meningitidis NOT DETECTED NOT DETECTED Final   Pseudomonas aeruginosa NOT DETECTED NOT DETECTED Final   Stenotrophomonas maltophilia NOT DETECTED NOT DETECTED Final   Candida albicans NOT DETECTED NOT DETECTED Final   Candida auris NOT DETECTED NOT DETECTED Final   Candida glabrata NOT DETECTED NOT DETECTED Final   Candida krusei NOT DETECTED NOT DETECTED Final   Candida parapsilosis NOT DETECTED NOT DETECTED Final   Candida tropicalis NOT DETECTED NOT DETECTED Final   Cryptococcus neoformans/gattii NOT DETECTED NOT DETECTED Final    Comment: Performed at [facility], [address], [place], [place] [address]    [x]  Patient with positive blood culture finding. EDP has already been notified per note from RN on 12/28. No further action needed.  1/29, PharmD, BCPS 03/08/2021 9:58 AM ED Clinical Pharmacist -  [phone]

## 2021-03-09 LAB — CULTURE, BLOOD (ROUTINE X 2)
Culture: NO GROWTH
Special Requests: ADEQUATE

## 2021-04-16 ENCOUNTER — Emergency Department (HOSPITAL_COMMUNITY)
Admission: EM | Admit: 2021-04-16 | Discharge: 2021-04-16 | Disposition: A | Discharge status: Home / Self Care | Payer: 59 | Attending: Emergency Medicine | Admitting: Emergency Medicine

## 2021-04-16 ENCOUNTER — Other Ambulatory Visit: Payer: Self-pay

## 2021-04-16 ENCOUNTER — Emergency Department (HOSPITAL_COMMUNITY)
Admission: EM | Admit: 2021-04-16 | Discharge: 2021-04-16 | Discharge status: Against Medical Advice | Payer: Medicaid Other

## 2021-04-16 ENCOUNTER — Encounter (HOSPITAL_COMMUNITY): Payer: Self-pay

## 2021-04-16 DIAGNOSIS — Z760 Encounter for issue of repeat prescription: Secondary | ICD-10-CM | POA: Diagnosis not present

## 2021-04-16 DIAGNOSIS — B372 Candidiasis of skin and nail: Secondary | ICD-10-CM | POA: Diagnosis not present

## 2021-04-16 DIAGNOSIS — B3731 Acute candidiasis of vulva and vagina: Secondary | ICD-10-CM | POA: Insufficient documentation

## 2021-04-16 DIAGNOSIS — B379 Candidiasis, unspecified: Secondary | ICD-10-CM

## 2021-04-16 MED ORDER — FLUCONAZOLE 150 MG PO TABS: 300.0000 mg | ORAL_TABLET | Freq: Every day | ORAL | 0 refills | Status: AC

## 2021-04-16 MED ORDER — KETOCONAZOLE 1 % EX SHAM: 1.0000 | MEDICATED_SHAMPOO | Freq: Two times a day (BID) | CUTANEOUS | 0 refills | Status: DC

## 2021-04-16 MED ORDER — FLUCONAZOLE 150 MG PO TABS
300.0000 mg | ORAL_TABLET | Freq: Once | ORAL | Status: AC
Start: 2021-04-16 — End: 2021-04-16
  Administered 2021-04-16: 300 mg via ORAL
  Filled 2021-04-16: qty 2

## 2021-04-16 NOTE — Discharge Instructions (Signed)
I have given you your first dose of Diflucan here in the ED for your yeast infection. Please take the other dose two weeks from now on 04/30/21.   I have also sent in a prescription for the ketoconazole shampoo that you say has worked well for you in the past. You can use this 1-2 times daily over your body. Try to keep the area clean and dry. You can also get this kind of shampoo over the counter - the brand name for it is Nizoral, however if you type in "ketoconazole shampoo" into good/amazon, several generic options are available as well.   Please follow up with a primary care doctor once your insurance kicks in so that you have someone who can help figure out why this keeps recurring. I hope you feel better soon!

## 2021-04-16 NOTE — ED Provider Notes (Signed)
[facility]-EMERGENCY DEPT Provider Note   CSN: [id number] Arrival date & time: 04/16/21  2120     History  Chief Complaint  Patient presents with   Medication Refill    Larry Kline is a 36 y.o. male with medical history significant for obesity, recurrent yeast infections of the groin and tinea versicolor who presents to the ED requesting refill of medications for yeast infection.  He states symptoms usually resolve with Diflucan and the "red shampoo" that people prescribed.  He states the cream does not seem to work for him very well.  Patient endorses itchiness and raw skin on the bilateral groin area consistent with his previous yeast infections.  He has insurance starting in the next month or 2, and he knows that he will establish primary care after that to evaluate why he has these recurrent infections.     Medication Refill     Home Medications Prior to Admission medications   Medication Sig Start Date End Date Taking? Authorizing Provider  fluconazole (DIFLUCAN) 150 MG tablet Take 2 tablets (300 mg total) by mouth daily for 1 dose. First dose given in ED 04/16/2021. Take second dose 04/30/21 04/30/21 05/01/21 Yes Felice Deem R, PA-C  KETOCONAZOLE, TOPICAL, 1 % SHAM Apply 1 Squirt topically 2 (two) times daily. 04/16/21  Yes Raynald Blend R, PA-C  azithromycin (ZITHROMAX) 250 MG tablet Take 1 tablet (250 mg total) by mouth daily. Take first 2 tablets together, then 1 every day until finished. 03/04/21   Roemhildt, Lorin T, PA-C  clotrimazole (LOTRIMIN) 1 % cream Apply to affected area 2 times daily 12/28/20   Charlynne Pander, MD  ondansetron (ZOFRAN) 4 MG tablet Take 1 tablet (4 mg total) by mouth every 6 (six) hours. 03/04/21   Roemhildt, Lorin T, PA-C      Allergies    Penicillins    Review of Systems   Review of Systems  Constitutional:  Negative for fever.  HENT: Negative.    Eyes: Negative.   Respiratory:  Negative for shortness of breath.    Cardiovascular: Negative.   Gastrointestinal:  Negative for abdominal pain and vomiting.  Endocrine: Negative.   Genitourinary: Negative.   Musculoskeletal: Negative.   Skin:  Positive for rash.  Neurological:  Negative for headaches.  All other systems reviewed and are negative.  Physical Exam Updated Vital Signs BP (!) 147/84 (BP Location: Left Arm)    Pulse 90    Temp 97.8 F (36.6 C) (Oral)    Resp 16    Ht 5\' 11"  (1.803 m)    Wt 127 kg    SpO2 99%    BMI 39.05 kg/m  Physical Exam Vitals and nursing note reviewed.  Constitutional:      General: He is not in acute distress.    Appearance: He is not ill-appearing.  HENT:     Head: Atraumatic.  Eyes:     Conjunctiva/sclera: Conjunctivae normal.  Cardiovascular:     Rate and Rhythm: Normal rate and regular rhythm.     Pulses: Normal pulses.     Heart sounds: No murmur heard. Pulmonary:     Effort: Pulmonary effort is normal. No respiratory distress.     Breath sounds: Normal breath sounds.  Abdominal:     General: Abdomen is flat. There is no distension.     Palpations: Abdomen is soft.     Tenderness: There is no abdominal tenderness.  Musculoskeletal:        General: Normal range  of motion.     Cervical back: Normal range of motion.  Skin:    General: Skin is warm and dry.     Capillary Refill: Capillary refill takes less than 2 seconds.     Findings: Rash present.     Comments: Erythematous rash of the bilateral groin and pubis area.   Dark-colored patches of pigmentation scattered across his back  Neurological:     General: No focal deficit present.     Mental Status: He is alert.  Psychiatric:        Mood and Affect: Mood normal.    ED Results / Procedures / Treatments   Labs (all labs ordered are listed, but only abnormal results are displayed) Labs Reviewed - No data to display  EKG None  Radiology No results found.  Procedures Procedures    Medications Ordered in ED Medications   fluconazole (DIFLUCAN) tablet 300 mg (300 mg Oral Given 04/16/21 2231)    ED Course/ Medical Decision Making/ A&P                           Medical Decision Making Risk OTC drugs. Prescription drug management.   History:  Per HPI  Initial impression:  This patient presents to the ED for concern of rash, this involves an extensive number of treatment options, and is a complaint that carries with it a high risk of complications and morbidity.     ED Course: Patient has inguinal rash consistent with yeast infection.  He also has mild flareup of tinea versicolor across his back and chest.  He notes he has responded well to the unknown medicated shampoo and the Diflucan pill.  I believe he is referencing ketoconazole shampoo.  Offered cream but he does not feel this works for him.  First dose of Diflucan given here in the ED, second dose to be taken in 2 weeks.  I did refill prescription for ketoconazole shampoo, he may also informed him that he may get this over-the-counter if he needed in the future.  Medicines ordered and prescription drug management:  I ordered medication including: Diflucan for yeast infection   Reevaluation of the patient after these medicines showed that the patient stayed the same I have reviewed the patients home medicines and have made adjustments as needed   Disposition:  After consideration of the diagnostic results, physical exam, history and the patients response to treatment feel that the patent would benefit from discharge with outpatient follow up.   Yeast infection of groin: Patient has repeated episodes of yeast infections and tinea versicolor.  He understands that he needs to find a primary care provider who can manage this chronically for him.  He has insurance starting in the next month or 2 and we will set this up for them.  I prescribed him Diflucan, first dose today second dose in 2 weeks as well as the ketoconazole 2% shampoo.  Return precautions  were discussed.  All questions asked and answered.  Discharged home in good condition.   Final Clinical Impression(s) / ED Diagnoses Final diagnoses:  Yeast infection    Rx / DC Orders ED Discharge Orders          Ordered    fluconazole (DIFLUCAN) 150 MG tablet  Daily        04/16/21 2215    KETOCONAZOLE, TOPICAL, 1 % SHAM  2 times daily        04/16/21 2215  Janell Quiet, [place] 04/16/21 2319    Linwood Dibbles, MD 04/19/21 [phone]

## 2021-04-16 NOTE — ED Triage Notes (Signed)
Pt states he needs a prescription refill for his yeast infection, states he does not remember the name of it. Pt states the irritated areas are on his abdomen.

## 2021-04-16 NOTE — ED Notes (Signed)
While attempting to get vitals on pt, pt asked what the wait time was and when told pt stated he was not gonna wait because he just needs a prescription for known infection

## 2021-04-16 NOTE — ED Notes (Signed)
An After Visit Summary was printed and given to the patient. Discharge instructions given and no further questions at this time.  

## 2021-10-02 ENCOUNTER — Ambulatory Visit (HOSPITAL_COMMUNITY)
Admission: EM | Admit: 2021-10-02 | Discharge: 2021-10-02 | Disposition: A | Discharge status: Home / Self Care | Payer: 59 | Attending: Physician Assistant | Admitting: Physician Assistant

## 2021-10-02 ENCOUNTER — Encounter (HOSPITAL_COMMUNITY): Payer: Self-pay | Admitting: *Deleted

## 2021-10-02 DIAGNOSIS — Z113 Encounter for screening for infections with a predominantly sexual mode of transmission: Secondary | ICD-10-CM | POA: Diagnosis not present

## 2021-10-02 DIAGNOSIS — R21 Rash and other nonspecific skin eruption: Secondary | ICD-10-CM | POA: Diagnosis not present

## 2021-10-02 DIAGNOSIS — B356 Tinea cruris: Secondary | ICD-10-CM | POA: Diagnosis not present

## 2021-10-02 LAB — COMPREHENSIVE METABOLIC PANEL
ALT: 19 U/L (ref 0–44)
AST: 16 U/L (ref 15–41)
Albumin: 4 g/dL (ref 3.5–5.0)
Alkaline Phosphatase: 43 U/L (ref 38–126)
Anion gap: 7 (ref 5–15)
BUN: 17 mg/dL (ref 6–20)
CO2: 23 mmol/L (ref 22–32)
Calcium: 8.9 mg/dL (ref 8.9–10.3)
Chloride: 107 mmol/L (ref 98–111)
Creatinine, Ser: 1.04 mg/dL (ref 0.61–1.24)
GFR, Estimated: >60 mL/min (ref >60)
Glucose, Bld: 94 mg/dL (ref 70–99)
Potassium: 4.4 mmol/L (ref 3.5–5.1)
Sodium: 137 mmol/L (ref 135–145)
Total Bilirubin: 0.3 mg/dL (ref 0.3–1.2)
Total Protein: 7 g/dL (ref 6.5–8.1)

## 2021-10-02 LAB — HIV ANTIBODY (ROUTINE TESTING W REFLEX): HIV Screen 4th Generation wRfx: NONREACTIVE

## 2021-10-02 MED ORDER — CLOTRIMAZOLE 1 % EX CREA
TOPICAL_CREAM | CUTANEOUS | 1 refills | Status: DC
Start: 2021-10-02 — End: 2021-10-02

## 2021-10-02 MED ORDER — FLUCONAZOLE 150 MG PO TABS: 150.0000 mg | ORAL_TABLET | Freq: Every day | ORAL | 1 refills | Status: DC

## 2021-10-02 MED ORDER — FLUCONAZOLE 150 MG PO TABS: 150.0000 mg | ORAL_TABLET | Freq: Every day | ORAL | Status: DC

## 2021-10-02 MED ORDER — KETOCONAZOLE 1 % EX SHAM: 1.0000 | MEDICATED_SHAMPOO | Freq: Two times a day (BID) | CUTANEOUS | 2 refills | Status: DC

## 2021-10-02 MED ORDER — CLOTRIMAZOLE 1 % EX CREA: TOPICAL_CREAM | CUTANEOUS | 1 refills | Status: DC

## 2021-10-02 NOTE — Discharge Instructions (Signed)
Advised to continue taking the Diflucan tablet 1 initially and then repeat in 5 days if needed for the yeast infection. Advised to continue using the ketoconazole shampoo to also help decrease the incidence of yeast infection. I have drawn for a CMP and a hemoglobin A1c as these will detect any presence of diabetes or prediabetes.  These labs will return in 48 hours and if you do not get a call then that means they are negative.  You can check on MyChart and see the labs as soon as they are posted within that 48-hour time frame. Vies to follow-up with PCP or return to urgent care if symptoms fail to improve.

## 2021-10-02 NOTE — ED Triage Notes (Signed)
Pt reports recurrent bilat groin yeast infections; states simply keeps getting refills of ketoconazole shampoo. Reports starting with rash to area again over past 2-3 weeks.  Also wishes to be checked for STDs, but denies any sxs.

## 2021-10-02 NOTE — ED Provider Notes (Signed)
[facility]    CSN: [id number] Arrival date & time: 10/02/21  1136      History   Chief Complaint Chief Complaint  Patient presents with   Rash   STD check    HPI Larry Kline is a 36 y.o. male.   36 year old male presents for STI screening and renewal of medications for tinea cruris.  Patient indicates that he would like to have medications renewed for his yeast infection that he gets along the groin areas and also along the lower abdomen where the skin overlaps.  Patient indicates that the ketoconazole shampoo and the Diflucan tablets work well without any problems or side effects.  Patient also desires to have STI screening for HIV and RPR.  Patient relates he is not having any penile discharge or other symptoms and he declines to do a swab at the present time.  Patient also wishes to be checked for diabetes although he is not having symptoms there is a family history present with his mother and aunt having diabetes.  Patient denies fever or chills, no nausea vomiting, no dysuria.   Rash   History reviewed. No pertinent past medical history.  Patient Active Problem List   Diagnosis Date Noted   TOBACCO USER 03/11/2009   OBESITY, NOS 05/06/2006    History reviewed. No pertinent surgical history.     Home Medications    Prior to Admission medications   Medication Sig Start Date End Date Taking? Authorizing Provider  fluconazole (DIFLUCAN) 150 MG tablet Take 1 tablet (150 mg total) by mouth daily. May repeat in 5 days if needed. 10/02/21  Yes Ellsworth Lennox, PA-C  azithromycin (ZITHROMAX) 250 MG tablet Take 1 tablet (250 mg total) by mouth daily. Take first 2 tablets together, then 1 every day until finished. 03/04/21   Roemhildt, Lorin T, PA-C  clotrimazole (LOTRIMIN) 1 % cream Apply to affected area 2 times daily 10/02/21   Ellsworth Lennox, PA-C  KETOCONAZOLE, TOPICAL, 1 % SHAM Apply 1 Squirt topically 2 (two) times daily. 10/02/21   Ellsworth Lennox, PA-C   ondansetron (ZOFRAN) 4 MG tablet Take 1 tablet (4 mg total) by mouth every 6 (six) hours. 03/04/21   Roemhildt, Lorin T, PA-C    Family History Family History  Problem Relation Age of Onset   Diabetes Mother     Social History Social History   Tobacco Use   Smoking status: Former    Packs/day: 0.33    Types: Cigarettes   Smokeless tobacco: Never  Vaping Use   Vaping Use: Former  Substance Use Topics   Alcohol use: Not Currently   Drug use: Never     Allergies   Penicillins   Review of Systems Review of Systems  Skin:  Positive for rash (groin areas).     Physical Exam Triage Vital Signs ED Triage Vitals  Enc Vitals Group     BP 10/02/21 1155 139/71     Pulse Rate 10/02/21 1153 63     Resp 10/02/21 1153 14     Temp 10/02/21 1153 98.3 F (36.8 C)     Temp Source 10/02/21 1153 Oral     SpO2 10/02/21 1153 98 %     Weight --      Height --      Head Circumference --      Peak Flow --      Pain Score 10/02/21 1155 0     Pain Loc --      Pain Edu? --  Excl. in GC? --    No data found.  Updated Vital Signs BP 139/71   Pulse 63   Temp 98.3 F (36.8 C) (Oral)   Resp 14   SpO2 98%   Visual Acuity Right Eye Distance:   Left Eye Distance:   Bilateral Distance:    Right Eye Near:   Left Eye Near:    Bilateral Near:     Physical Exam Constitutional:      Appearance: Normal appearance.  Genitourinary:    Comments: Genitals: There is mild skin redness, irritation along the groin folds bilaterally and where the abdominal skin overlaps.  There is no scaling or drainage from these areas at the present time, no evidence of excoriations. Neurological:     Mental Status: He is alert.      UC Treatments / Results  Labs (all labs ordered are listed, but only abnormal results are displayed) Labs Reviewed  RPR  HIV ANTIBODY (ROUTINE TESTING W REFLEX)  COMPREHENSIVE METABOLIC PANEL  HEMOGLOBIN A1C    EKG   Radiology No results  found.  Procedures Procedures (including critical care time)  Medications Ordered in UC Medications - No data to display  Initial Impression / Assessment and Plan / UC Course  I have reviewed the triage vital signs and the nursing notes.  Pertinent labs & imaging results that were available during my care of the patient were reviewed by me and considered in my medical decision making (see chart for details).    Plan: 1.  Advised to continue using the ketoconazole shampoo and the Diflucan tablets to help all of the tinea cruris and yeast infections. 2.  A CMP and hemoglobin A1c are pending. 4.  STI screening for HIV and RPR is pending 3.  Advised to follow-up with PCP or return to urgent care if symptoms fail to improve. Final Clinical Impressions(s) / UC Diagnoses   Final diagnoses:  Routine screening for STI (sexually transmitted infection)  Rash  Tinea cruris     Discharge Instructions      Advised to continue taking the Diflucan tablet 1 initially and then repeat in 5 days if needed for the yeast infection. Advised to continue using the ketoconazole shampoo to also help decrease the incidence of yeast infection. I have drawn for a CMP and a hemoglobin A1c as these will detect any presence of diabetes or prediabetes.  These labs will return in 48 hours and if you do not get a call then that means they are negative.  You can check on MyChart and see the labs as soon as they are posted within that 48-hour time frame. Vies to follow-up with PCP or return to urgent care if symptoms fail to improve.    ED Prescriptions     Medication Sig Dispense Auth. Provider   KETOCONAZOLE, TOPICAL, 1 % SHAM Apply 1 Squirt topically 2 (two) times daily. 200 mL Ellsworth Lennox, PA-C   clotrimazole (LOTRIMIN) 1 % cream  (Status: Discontinued) Apply to affected area 2 times daily 30 g Ellsworth Lennox, PA-C   fluconazole (DIFLUCAN) 150 MG tablet Take 1 tablet (150 mg total) by mouth daily. May repeat  in 5 days if needed. 2 tablet Ellsworth Lennox, PA-C   clotrimazole (LOTRIMIN) 1 % cream Apply to affected area 2 times daily 30 g Ellsworth Lennox, PA-C      PDMP not reviewed this encounter.   Ellsworth Lennox, PA-C 10/02/21 1215

## 2021-10-03 LAB — CYTOLOGY, (ORAL, ANAL, URETHRAL) ANCILLARY ONLY
Chlamydia: NEGATIVE
Comment: NEGATIVE
Comment: NEGATIVE
Comment: NORMAL
Neisseria Gonorrhea: NEGATIVE
Trichomonas: NEGATIVE

## 2021-10-03 LAB — HEMOGLOBIN A1C
Hgb A1c MFr Bld: 4.7 % — ABNORMAL LOW (ref 4.8–5.6)
Mean Plasma Glucose: 88 mg/dL

## 2021-10-03 LAB — RPR: RPR Ser Ql: NONREACTIVE

## 2022-03-18 IMAGING — CR DG CHEST 2V
2 series · 2 of 2 positions shown · non-contrast
Comparison: Chest x-ray 03/03/2021.

CLINICAL DATA: 35-year-old male with fever, cough and shortness of
breath for the past 2 days.

EXAM:
CHEST - 2 VIEW

[chest pa]
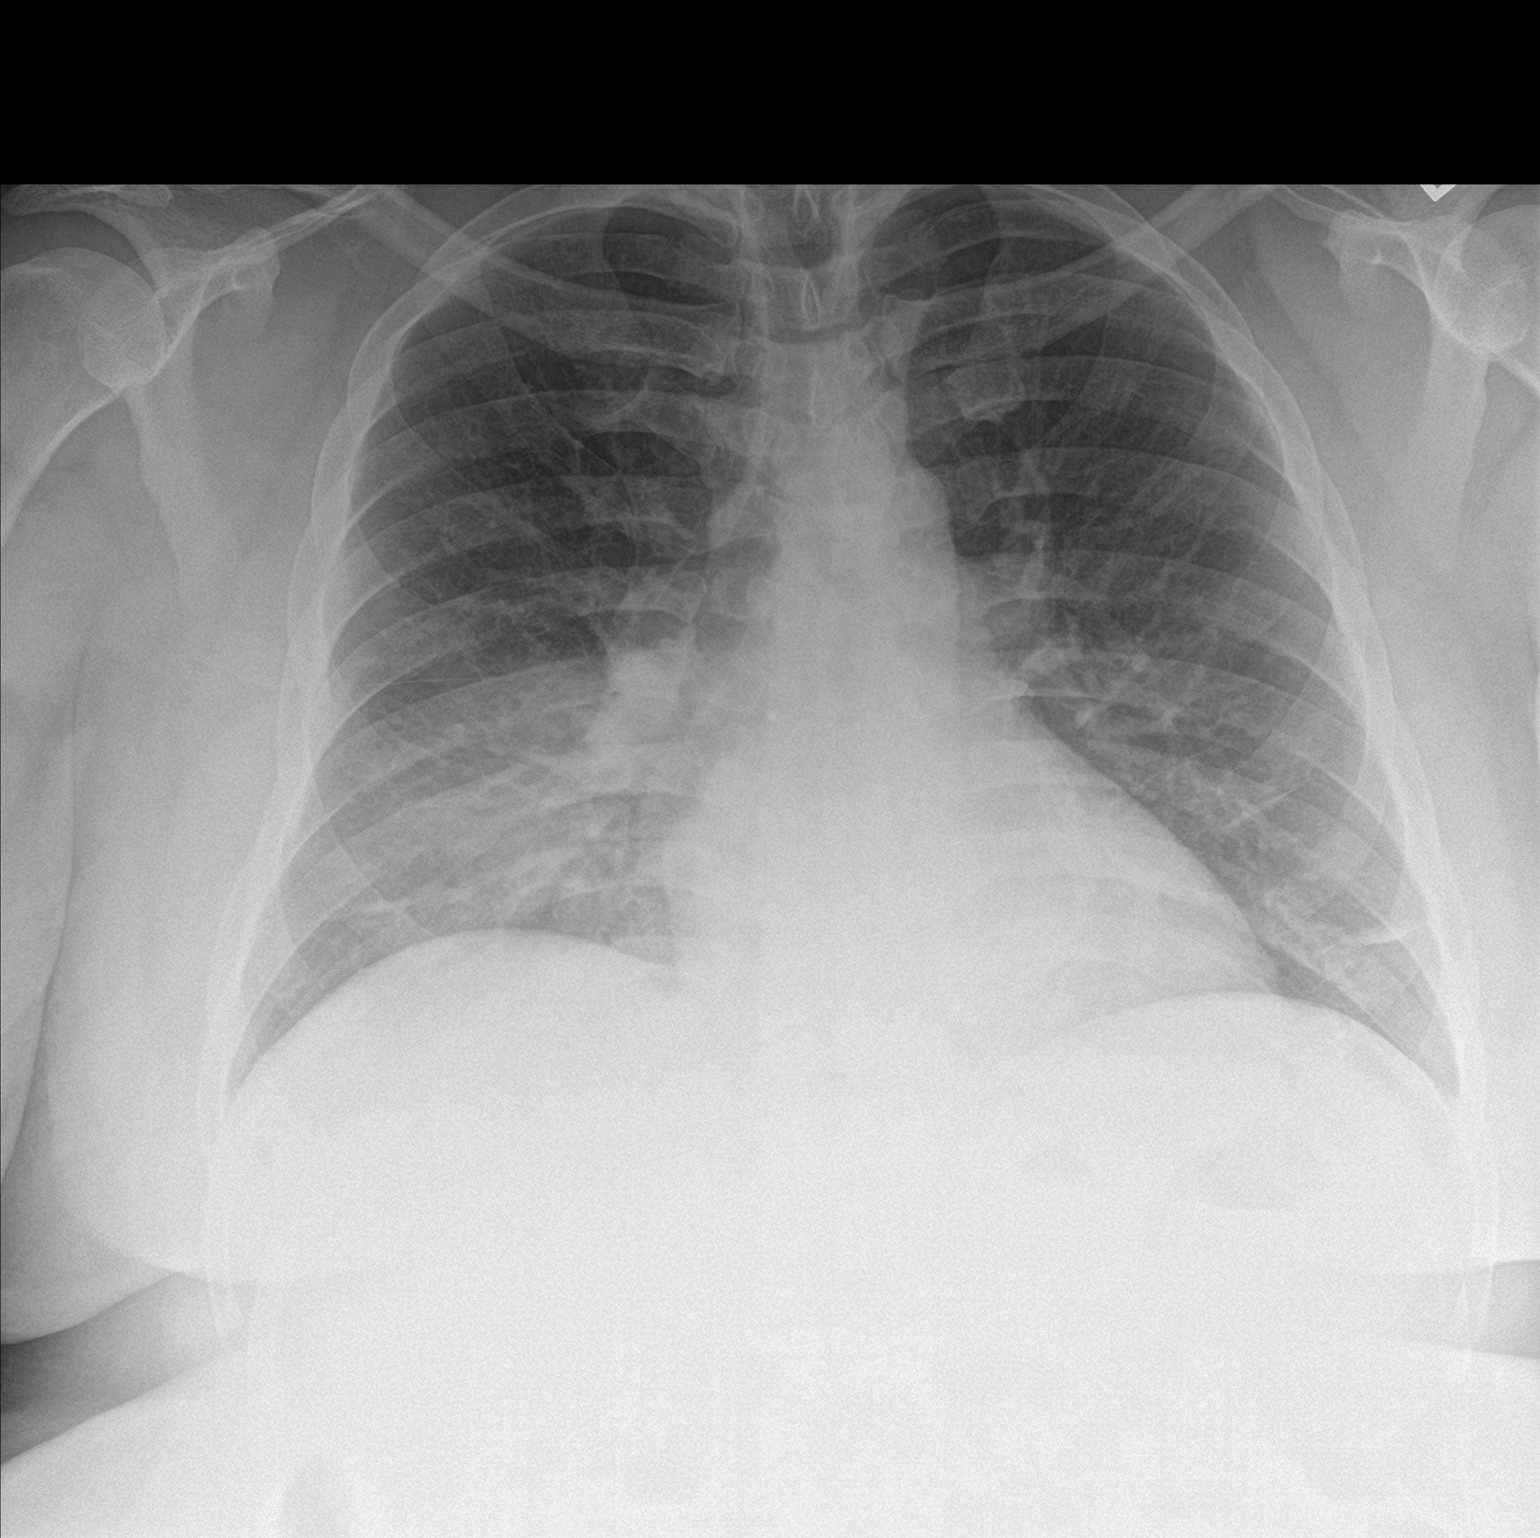

[chest lat]
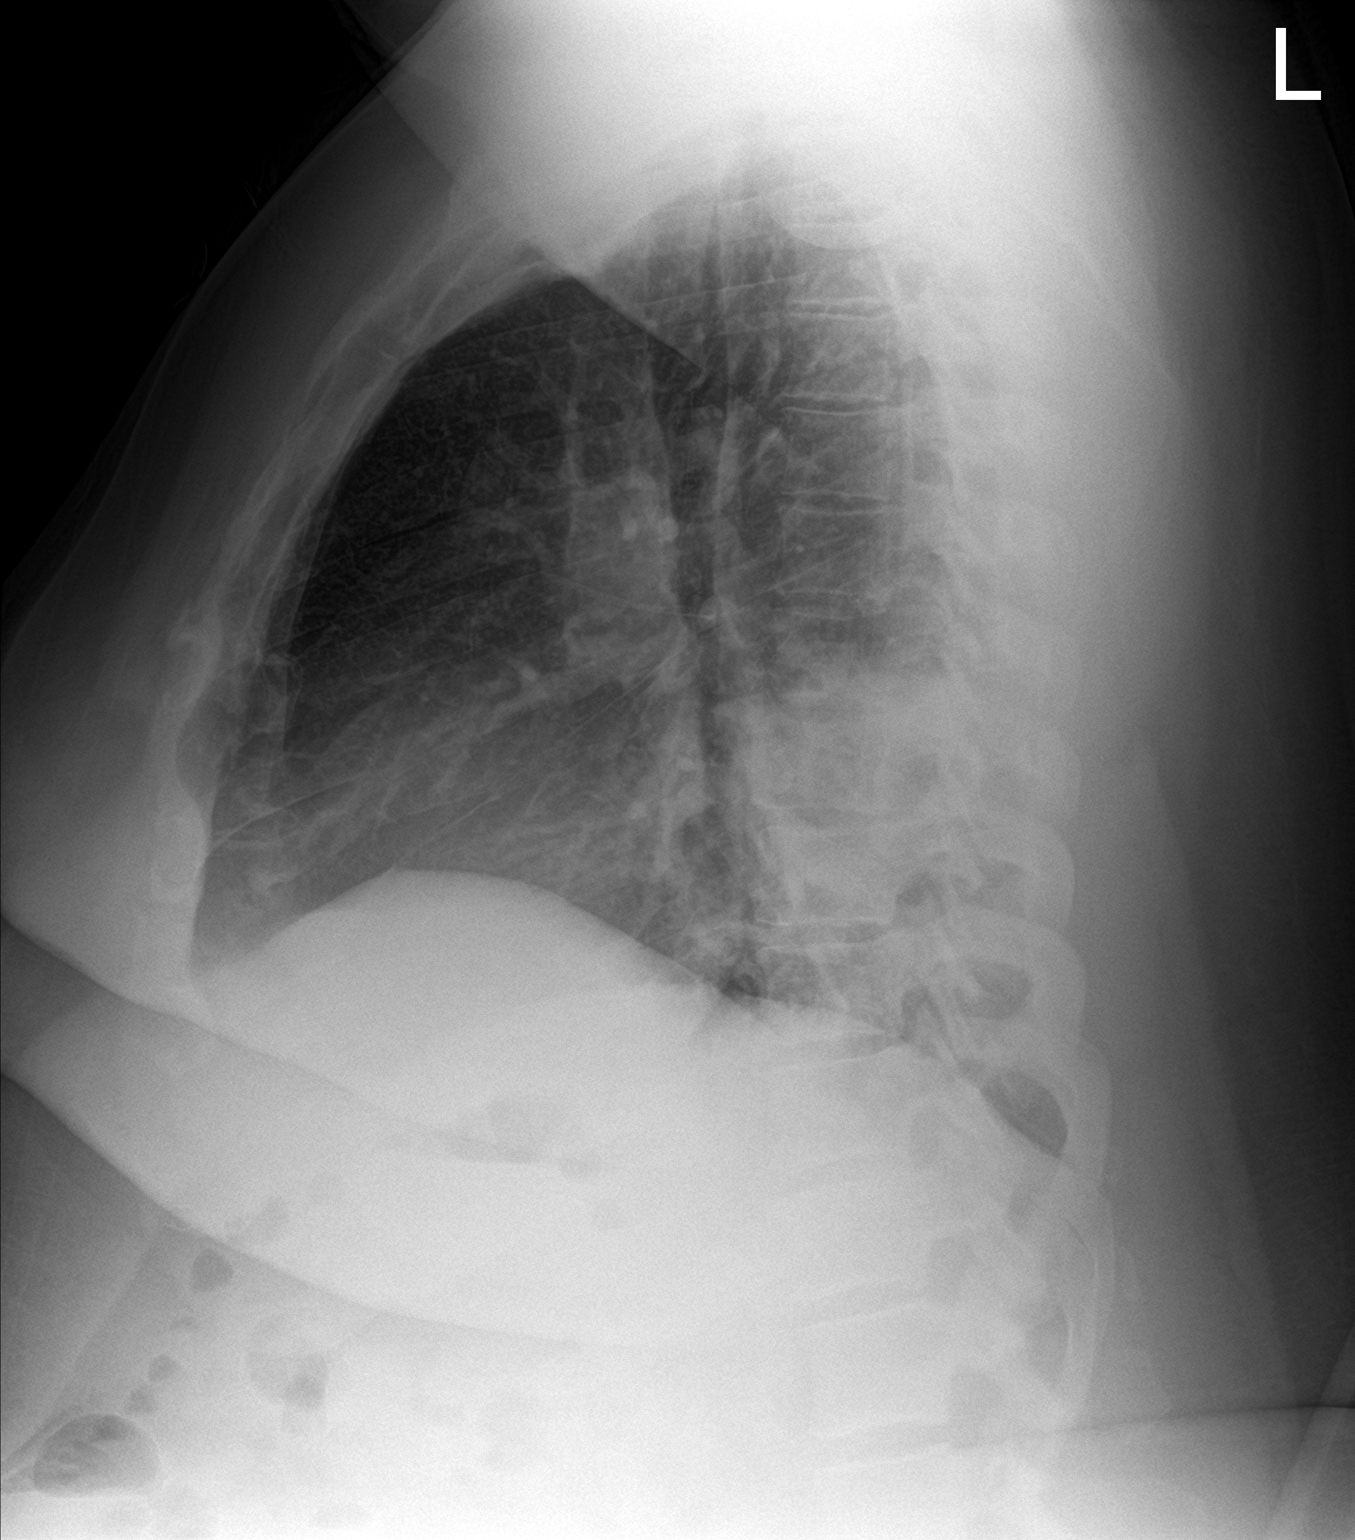

[2 of 2 positions shown; findings below may reference images not displayed]

FINDINGS: Extensive airspace consolidation again noted in the posterior aspect
of the right lower lobe. Subsegmental atelectasis in the periphery
of the left lung base. No pneumothorax. No evidence of pulmonary
edema. No pleural effusions. Heart size is normal. Upper mediastinal
contours are within normal limits.
IMPRESSION: 1. Persistent right lower lobe pneumonia.

## 2022-04-23 ENCOUNTER — Encounter (HOSPITAL_COMMUNITY): Payer: Self-pay

## 2022-04-23 ENCOUNTER — Ambulatory Visit (HOSPITAL_COMMUNITY)
Admission: EM | Admit: 2022-04-23 | Discharge: 2022-04-23 | Disposition: A | Discharge status: Home / Self Care | Payer: 59 | Attending: Emergency Medicine | Admitting: Emergency Medicine

## 2022-04-23 DIAGNOSIS — B372 Candidiasis of skin and nail: Secondary | ICD-10-CM | POA: Diagnosis not present

## 2022-04-23 MED ORDER — FLUCONAZOLE 150 MG PO TABS: 150.0000 mg | ORAL_TABLET | Freq: Every day | ORAL | 1 refills | Status: DC

## 2022-04-23 MED ORDER — KETOCONAZOLE 1 % EX SHAM: 1.0000 | MEDICATED_SHAMPOO | Freq: Two times a day (BID) | CUTANEOUS | 1 refills | Status: DC

## 2022-04-23 NOTE — ED Notes (Signed)
Pt called not in [place].

## 2022-04-23 NOTE — ED Provider Notes (Signed)
HPI  SUBJECTIVE:  Larry Kline is a 37 y.o. male who presents with a pruritic rash with raw, irritated skin and an odor in his groin and underneath his belly starting 2 days ago.  No new lotions, soaps, detergents, crusting, discharge, erythema.  He tried an unknown prescription cream without improvement in his symptoms.  Symptoms are better when he keeps the area moist, worse when it dries out.  He has a past medical history of recurrent tinea cruris, skin yeast infections.  No history of diabetes.  PCP: None.  He has been seen before with identical symptoms, and has been sent home with 2 doses of Diflucan and ketoconazole shampoo which he states works well.  He denies any side effects with these medications.    History reviewed. No pertinent past medical history.  History reviewed. No pertinent surgical history.  Family History  Problem Relation Age of Onset   Diabetes Mother     Social History   Tobacco Use   Smoking status: Former    Packs/day: 0.33    Types: Cigarettes   Smokeless tobacco: Never  Vaping Use   Vaping Use: Former  Substance Use Topics   Alcohol use: Not Currently   Drug use: Never    No current facility-administered medications for this encounter.  Current Outpatient Medications:    clotrimazole (LOTRIMIN) 1 % cream, Apply to affected area 2 times daily, Disp: 30 g, Rfl: 1   fluconazole (DIFLUCAN) 150 MG tablet, Take 1 tablet (150 mg total) by mouth daily. May repeat in 3 days if needed., Disp: 2 tablet, Rfl: 1   KETOCONAZOLE, TOPICAL, 1 % SHAM, Apply 1 Squirt topically 2 (two) times daily., Disp: 200 mL, Rfl: 1  Allergies  Allergen Reactions   Penicillins      ROS  As noted in HPI.   Physical Exam  BP (!) 149/89 (BP Location: Right Arm)   Pulse 60   Temp 98.2 F (36.8 C) (Oral)   Resp 16   SpO2 98%   Constitutional: Well developed, well nourished, no acute distress Eyes:  EOMI, conjunctiva normal bilaterally HENT: Normocephalic,  atraumatic,mucus membranes moist Respiratory: Normal inspiratory effort Cardiovascular: Normal rate GI: nondistended skin: Hypopigmented symmetric slightly tender rash under pannus and in between groin.     Musculoskeletal: no deformities Neurologic: Alert & oriented x 3, no focal neuro deficits Psychiatric: Speech and behavior appropriate   ED Course   Medications - No data to display  Orders Placed This Encounter  Procedures   Nursing Communication Please set up with PCP b/f d/c    Please set up with PCP b/f d/c    Standing Status:   Standing    Number of Occurrences:   1    No results found for this or any previous visit (from the past 24 hour(s)). No results found.  ED Clinical Impression  1. Skin yeast infection      ED Assessment/Plan     Patient was checked for diabetes on his last visit, he was not found to be diabetic.  A1c was 4.7. Presentation consistent with recurrent tinea cruris/yeast infection.  There is no evidence of secondary bacterial infection at this time.  Will prescribe Diflucan and ketoconazole shampoo.  Advised patient to try self off carefully after bathing.  Will set up with PCP prior to discharge.  Discussed MDM, treatment plan, and plan for follow-up with patient.  patient agrees with plan.   Meds ordered this encounter  Medications   KETOCONAZOLE, TOPICAL,  1 % SHAM    Sig: Apply 1 Squirt topically 2 (two) times daily.    Dispense:  200 mL    Refill:  1   fluconazole (DIFLUCAN) 150 MG tablet    Sig: Take 1 tablet (150 mg total) by mouth daily. May repeat in 3 days if needed.    Dispense:  2 tablet    Refill:  1      *This clinic note was created using [employer]obby[employer]. Therefore, there may be occasional mistakes despite careful proofreading.  ?    Melynda Ripple, MD 04/24/22 [phone]

## 2022-04-23 NOTE — ED Triage Notes (Signed)
Pt states rash in his groin for the past 2 days. States he gets this rash every few months and gets a prescription for medication and it clears up.

## 2022-04-23 NOTE — Discharge Instructions (Signed)
Drive yourself off carefully with a hair dryer after bathing.

## 2022-04-23 NOTE — ED Notes (Signed)
No answer from lobby  

## 2022-05-15 ENCOUNTER — Encounter: Payer: 59 | Admitting: Nurse Practitioner

## 2022-05-18 NOTE — Progress Notes (Signed)
error 

## 2022-11-12 ENCOUNTER — Ambulatory Visit (HOSPITAL_COMMUNITY)
Admission: EM | Admit: 2022-11-12 | Discharge: 2022-11-12 | Disposition: A | Discharge status: Home / Self Care | Payer: 59 | Attending: Nurse Practitioner | Admitting: Nurse Practitioner

## 2022-11-12 ENCOUNTER — Encounter (HOSPITAL_COMMUNITY): Payer: Self-pay | Admitting: *Deleted

## 2022-11-12 DIAGNOSIS — Z113 Encounter for screening for infections with a predominantly sexual mode of transmission: Secondary | ICD-10-CM | POA: Insufficient documentation

## 2022-11-12 DIAGNOSIS — B372 Candidiasis of skin and nail: Secondary | ICD-10-CM | POA: Insufficient documentation

## 2022-11-12 LAB — HIV ANTIBODY (ROUTINE TESTING W REFLEX): HIV Screen 4th Generation wRfx: NONREACTIVE

## 2022-11-12 MED ORDER — FLUCONAZOLE 150 MG PO TABS: 150.0000 mg | ORAL_TABLET | Freq: Once | ORAL | 0 refills | Status: AC

## 2022-11-12 MED ORDER — KETOCONAZOLE 1 % EX SHAM: 1.0000 | MEDICATED_SHAMPOO | Freq: Two times a day (BID) | CUTANEOUS | 0 refills | Status: DC

## 2022-11-12 NOTE — ED Notes (Signed)
At bedside for provider exam and collection of anal sample/cyto

## 2022-11-12 NOTE — ED Provider Notes (Signed)
[facility]    CSN: [id number] Arrival date & time: 11/12/22  1019      History   Chief Complaint Chief Complaint  Patient presents with   SEXUALLY TRANSMITTED DISEASE    HPI Larry Kline is a 37 y.o. male.   Patient presents to the with 1 month history of itchiness in the groin, thinks yeast skin infection has recurred.  Reports there is a odor that is most like bread coming from air groin and very raw/red skin.  Reports history of yeast infection in their folds.  No fever or nausea/vomiting.  They are requesting fluconazole and ketoconazole topical shampoo as this usually works well for them.   Patient also concerned because condom broke during anal intercourse 4 to 5 days ago.  Patient was receiving the intercourse.  Requesting STI testing today.  Denies symptoms including penile discharge, pelvic pain, groin swelling, burning with urination, sores or lesions on the testicles or penis.  No known exposures to STI.    History reviewed. No pertinent past medical history.  Patient Active Problem List   Diagnosis Date Noted   TOBACCO USER 03/11/2009   OBESITY, NOS 05/06/2006    History reviewed. No pertinent surgical history.     Home Medications    Prior to Admission medications   Medication Sig Start Date End Date Taking? Authorizing Provider  fluconazole (DIFLUCAN) 150 MG tablet Take 1 tablet (150 mg total) by mouth once for 1 dose. May repeat in 3 days if needed. 11/12/22 11/12/22  Valentino Nose, NP  KETOCONAZOLE, TOPICAL, 1 % SHAM Apply 1 Squirt topically 2 (two) times daily. 11/12/22   Valentino Nose, NP    Family History Family History  Problem Relation Age of Onset   Diabetes Mother     Social History Social History   Tobacco Use   Smoking status: Former    Current packs/day: 0.33    Types: Cigarettes   Smokeless tobacco: Never  Vaping Use   Vaping status: Former  Substance Use Topics   Alcohol use: Not Currently   Drug use: Never      Allergies   Penicillins   Review of Systems Review of Systems Per HPI  Physical Exam Triage Vital Signs ED Triage Vitals  Encounter Vitals Group     BP 11/12/22 1116 126/79     Systolic BP Percentile --      Diastolic BP Percentile --      Pulse Rate 11/12/22 1116 66     Resp 11/12/22 1116 18     Temp 11/12/22 1116 97.9 F (36.6 C)     Temp Source 11/12/22 1116 Oral     SpO2 11/12/22 1116 97 %     Weight --      Height --      Head Circumference --      Peak Flow --      Pain Score 11/12/22 1115 0     Pain Loc --      Pain Education --      Exclude from Growth Chart --    No data found.  Updated Vital Signs BP 126/79 (BP Location: Right Arm)   Pulse 66   Temp 97.9 F (36.6 C) (Oral)   Resp 18   SpO2 97%   Visual Acuity Right Eye Distance:   Left Eye Distance:   Bilateral Distance:    Right Eye Near:   Left Eye Near:    Bilateral Near:  Physical Exam Vitals and nursing note reviewed. Exam conducted with a chaperone present Selena Batten Lapan-Hutchens, RN).  Constitutional:      General: He is not in acute distress.    Appearance: Normal appearance. He is not toxic-appearing.  HENT:     Mouth/Throat:     Mouth: Mucous membranes are moist.     Pharynx: Oropharynx is clear.  Pulmonary:     Effort: Pulmonary effort is normal. No respiratory distress.  Genitourinary:    Pubic Area: Rash present.     Comments: Macerated skin noted to lower abdominal fold and bilateral groin folds with odorous smell.  No active drainage.  No swelling in the groin.  No anal discharge or swelling appreciated.  Skin:    General: Skin is warm and dry.     Capillary Refill: Capillary refill takes less than 2 seconds.     Coloration: Skin is not jaundiced or pale.  Neurological:     Mental Status: He is alert and oriented to person, place, and time.     Motor: No weakness.     Gait: Gait normal.  Psychiatric:        Behavior: Behavior is cooperative.      UC  Treatments / Results  Labs (all labs ordered are listed, but only abnormal results are displayed) Labs Reviewed  HIV ANTIBODY (ROUTINE TESTING W REFLEX)  RPR  CYTOLOGY, (ORAL, ANAL, URETHRAL) ANCILLARY ONLY  CYTOLOGY, (ORAL, ANAL, URETHRAL) ANCILLARY ONLY    EKG   Radiology No results found.  Procedures Procedures (including critical care time)  Medications Ordered in UC Medications - No data to display  Initial Impression / Assessment and Plan / UC Course  I have reviewed the triage vital signs and the nursing notes.  Pertinent labs & imaging results that were available during my care of the patient were reviewed by me and considered in my medical decision making (see chart for details).   Patient is well-appearing, normotensive, afebrile, not tachycardic, not tachypneic, oxygenating well on room air.    1. Yeast dermatitis Patient reports they have responded well to oral fluconazole, topical ketoconazole Will represcribe today Seek care if symptoms do not improve with this treatment  2. Screening examination for STD (sexually transmitted disease) Patient was receiver of anal intercourse, cytology is pending for both urethral and anal samples-treat as indicated HIV and syphilis testing also pending Safe sex practices discussed and recommended today; information given for regional infectious disease center and prep  The patient was given the opportunity to ask questions.  All questions answered to their satisfaction.  The patient is in agreement to this plan.    Final Clinical Impressions(s) / UC Diagnoses   Final diagnoses:  Yeast dermatitis  Screening examination for STD (sexually transmitted disease)   Discharge Instructions   None    ED Prescriptions     Medication Sig Dispense Auth. Provider   fluconazole (DIFLUCAN) 150 MG tablet Take 1 tablet (150 mg total) by mouth once for 1 dose. May repeat in 3 days if needed. 2 tablet Cathlean Marseilles A, NP    KETOCONAZOLE, TOPICAL, 1 % SHAM Apply 1 Squirt topically 2 (two) times daily. 200 mL Valentino Nose, NP      PDMP not reviewed this encounter.   Valentino Nose, NP 11/12/22 [phone]

## 2022-11-12 NOTE — ED Triage Notes (Signed)
Pt states they have yeast in groin area again and needs some more diflucan. Sx this time have been about a month.    They would also like STI testing

## 2022-11-12 NOTE — Discharge Instructions (Signed)
Take the fluconazole and use the ketoconazole shampoo as prescribed.  We will contact you if any of the testing from today comes back positive.  Continue condom use with every sexual encounter.

## 2022-11-13 LAB — CYTOLOGY, (ORAL, ANAL, URETHRAL) ANCILLARY ONLY
Chlamydia: NEGATIVE
Chlamydia: NEGATIVE
Comment: NEGATIVE
Comment: NORMAL
Comment: NEGATIVE
Comment: NEGATIVE
Comment: NORMAL
Neisseria Gonorrhea: NEGATIVE
Neisseria Gonorrhea: NEGATIVE
Trichomonas: NEGATIVE

## 2022-11-13 LAB — RPR: RPR Ser Ql: NONREACTIVE

## 2023-04-15 ENCOUNTER — Other Ambulatory Visit: Payer: Self-pay | Admitting: Nurse Practitioner

## 2023-04-22 ENCOUNTER — Other Ambulatory Visit: Payer: Self-pay | Admitting: Nurse Practitioner

## 2023-05-04 ENCOUNTER — Other Ambulatory Visit: Payer: Self-pay | Admitting: Nurse Practitioner

## 2023-05-18 ENCOUNTER — Encounter (HOSPITAL_COMMUNITY): Payer: Self-pay | Admitting: Emergency Medicine

## 2023-05-18 ENCOUNTER — Ambulatory Visit (HOSPITAL_COMMUNITY)
Admission: EM | Admit: 2023-05-18 | Discharge: 2023-05-18 | Disposition: A | Discharge status: Home / Self Care | Attending: Family Medicine | Admitting: Family Medicine

## 2023-05-18 DIAGNOSIS — B356 Tinea cruris: Secondary | ICD-10-CM | POA: Diagnosis not present

## 2023-05-18 MED ORDER — FLUCONAZOLE 150 MG PO TABS: 150.0000 mg | ORAL_TABLET | ORAL | 0 refills | Status: AC

## 2023-05-18 NOTE — ED Provider Notes (Signed)
 [facility]    CSN: [id number] Arrival date & time: 05/18/23  0850      History   Chief Complaint Chief Complaint  Patient presents with   Skin Problem   Medication Refill    HPI Larry Kline is a 38 y.o. male.   Here for irritation and rash and redness in his inguinal areas.  No fever and no dysuria.  He has had similar problem before and Diflucan helped it resolve  He is allergic to penicillin  History reviewed. No pertinent past medical history.  Patient Active Problem List   Diagnosis Date Noted   TOBACCO USER 03/11/2009   OBESITY, NOS 05/06/2006    History reviewed. No pertinent surgical history.     Home Medications    Prior to Admission medications   Medication Sig Start Date End Date Taking? Authorizing Provider  fluconazole (DIFLUCAN) 150 MG tablet Take 1 tablet (150 mg total) by mouth every 3 (three) days for 2 doses. 05/18/23 05/22/23 Yes Cedrica Brune, Janace Aris, MD    Family History Family History  Problem Relation Age of Onset   Diabetes Mother     Social History Social History   Tobacco Use   Smoking status: Former    Current packs/day: 0.33    Types: Cigarettes   Smokeless tobacco: Never  Vaping Use   Vaping status: Former  Substance Use Topics   Alcohol use: Not Currently   Drug use: Never     Allergies   Penicillins   Review of Systems Review of Systems   Physical Exam Triage Vital Signs ED Triage Vitals  Encounter Vitals Group     BP 05/18/23 0954 127/78     Systolic BP Percentile --      Diastolic BP Percentile --      Pulse Rate 05/18/23 0954 (!) 54     Resp 05/18/23 0954 16     Temp 05/18/23 0954 97.6 F (36.4 C)     Temp Source 05/18/23 0954 Oral     SpO2 05/18/23 0954 96 %     Weight --      Height --      Head Circumference --      Peak Flow --      Pain Score 05/18/23 0953 0     Pain Loc --      Pain Education --      Exclude from Growth Chart --    No data found.  Updated Vital  Signs BP 127/78 (BP Location: Right Arm)   Pulse (!) 54   Temp 97.6 F (36.4 C) (Oral)   Resp 16   SpO2 96%   Visual Acuity Right Eye Distance:   Left Eye Distance:   Bilateral Distance:    Right Eye Near:   Left Eye Near:    Bilateral Near:     Physical Exam Vitals reviewed.  Constitutional:      Appearance: He is not ill-appearing or diaphoretic.  Skin:    Comments: The left inguinal area has a confluent mildly erythematous rash.  He states this is what it looks like in his other inguinal area  and his perineum  Neurological:     Mental Status: He is alert and oriented to person, place, and time.  Psychiatric:        Behavior: Behavior normal.      UC Treatments / Results  Labs (all labs ordered are listed, but only abnormal results are displayed) Labs Reviewed - No data to  display  EKG   Radiology No results found.  Procedures Procedures (including critical care time)  Medications Ordered in UC Medications - No data to display  Initial Impression / Assessment and Plan / UC Course  I have reviewed the triage vital signs and the nursing notes.  Pertinent labs & imaging results that were available during my care of the patient were reviewed by me and considered in my medical decision making (see chart for details).     Fluconazole is sent in to treat the candidiasis/tinea Final Clinical Impressions(s) / UC Diagnoses   Final diagnoses:  Tinea cruris     Discharge Instructions      Take fluconazole 150 mg--1 tablet every 3 days for 2 doses      ED Prescriptions     Medication Sig Dispense Auth. Provider   fluconazole (DIFLUCAN) 150 MG tablet Take 1 tablet (150 mg total) by mouth every 3 (three) days for 2 doses. 2 tablet Marlinda Mike Janace Aris, MD      PDMP not reviewed this encounter.   Zenia Resides, MD 05/18/23 1003

## 2023-05-18 NOTE — ED Triage Notes (Signed)
 Pt reports having irritation in groin area and needs a refill on yeast medication that got before here.

## 2023-05-18 NOTE — Discharge Instructions (Signed)
Take fluconazole 150 mg--1 tablet every 3 days for 2 doses

## 2023-09-07 ENCOUNTER — Ambulatory Visit

## 2023-09-21 ENCOUNTER — Ambulatory Visit (HOSPITAL_COMMUNITY)
Admission: RE | Admit: 2023-09-21 | Discharge: 2023-09-21 | Disposition: A | Discharge status: Home / Self Care | Source: Ambulatory Visit | Attending: Emergency Medicine | Admitting: Emergency Medicine

## 2023-09-21 ENCOUNTER — Encounter (HOSPITAL_COMMUNITY): Payer: Self-pay

## 2023-09-21 VITALS — BP 121/89 | HR 69 | Temp 98.1°F | Resp 18

## 2023-09-21 DIAGNOSIS — Z113 Encounter for screening for infections with a predominantly sexual mode of transmission: Secondary | ICD-10-CM | POA: Diagnosis present

## 2023-09-21 DIAGNOSIS — B356 Tinea cruris: Secondary | ICD-10-CM | POA: Insufficient documentation

## 2023-09-21 LAB — HIV ANTIBODY (ROUTINE TESTING W REFLEX): HIV Screen 4th Generation wRfx: NONREACTIVE

## 2023-09-21 MED ORDER — NYSTATIN 100000 UNIT/GM EX POWD: 1.0000 | Freq: Three times a day (TID) | CUTANEOUS | 0 refills | Status: AC

## 2023-09-21 MED ORDER — FLUCONAZOLE 150 MG PO TABS: ORAL_TABLET | ORAL | 0 refills | Status: AC

## 2023-09-21 MED ORDER — KETOCONAZOLE 2 % EX SHAM: 1.0000 | MEDICATED_SHAMPOO | CUTANEOUS | 0 refills | Status: AC

## 2023-09-21 NOTE — ED Provider Notes (Signed)
 [facility]    CSN: [id number] Arrival date & time: 09/21/23  1525      History   Chief Complaint Chief Complaint  Patient presents with   APPT- STD    HPI Larry Kline is a 38 y.o. male.   Presents requesting routine STD testing.  Patient denies any penile discharge, penile/testicular pain or swelling, dysuria, hematuria, urinary frequency/urgency, abdominal pain, and flank pain.  Patient denies any known exposures to STDs.  Patient also reports recurrent issue with jock itch.  Patient states that in the past he was given a shampoo that seem to help this, but then states that shampoo stopped working.  Patient states that he normally receives fluconazole  with relief of this and is requesting a refill of this.  Patient does not currently have a primary care provider and is requesting resources for this.  The history is provided by the patient and medical records.    History reviewed. No pertinent past medical history.  Patient Active Problem List   Diagnosis Date Noted   TOBACCO USER 03/11/2009   OBESITY, NOS 05/06/2006    History reviewed. No pertinent surgical history.     Home Medications    Prior to Admission medications   Medication Sig Start Date End Date Taking? Authorizing Provider  fluconazole  (DIFLUCAN ) 150 MG tablet Take one tablet today and one tablet in 3 days if symptoms persist. 09/21/23  Yes Johnie Flaming A, NP  ketoconazole  (NIZORAL ) 2 % shampoo Apply 1 Application topically 2 (two) times a week. 09/23/23  Yes Johnie Flaming A, NP  nystatin  (MYCOSTATIN /NYSTOP ) powder Apply 1 Application topically 3 (three) times daily. 09/21/23  Yes Johnie Flaming LABOR, NP    Family History Family History  Problem Relation Age of Onset   Diabetes Mother     Social History Social History   Tobacco Use   Smoking status: Former    Current packs/day: 0.33    Types: Cigarettes   Smokeless tobacco: Never  Vaping Use   Vaping status: Former   Substance Use Topics   Alcohol use: Not Currently   Drug use: Never     Allergies   Penicillins   Review of Systems Review of Systems  Per HPI  Physical Exam Triage Vital Signs ED Triage Vitals  Encounter Vitals Group     BP 09/21/23 1549 121/89     Girls Systolic BP Percentile --      Girls Diastolic BP Percentile --      Boys Systolic BP Percentile --      Boys Diastolic BP Percentile --      Pulse Rate 09/21/23 1549 69     Resp 09/21/23 1549 18     Temp 09/21/23 1549 98.1 F (36.7 C)     Temp Source 09/21/23 1549 Oral     SpO2 09/21/23 1549 96 %     Weight --      Height --      Head Circumference --      Peak Flow --      Pain Score 09/21/23 1550 0     Pain Loc --      Pain Education --      Exclude from Growth Chart --    No data found.  Updated Vital Signs BP 121/89 (BP Location: Right Arm)   Pulse 69   Temp 98.1 F (36.7 C) (Oral)   Resp 18   SpO2 96%   Visual Acuity Right Eye Distance:   Left Eye  Distance:   Bilateral Distance:    Right Eye Near:   Left Eye Near:    Bilateral Near:     Physical Exam Vitals and nursing note reviewed.  Constitutional:      General: He is awake. He is not in acute distress.    Appearance: Normal appearance. He is well-developed and well-groomed. He is not ill-appearing.  Genitourinary:    Comments: Exam declined Skin:    General: Skin is warm and dry.  Neurological:     Mental Status: He is alert.  Psychiatric:        Behavior: Behavior is cooperative.      UC Treatments / Results  Labs (all labs ordered are listed, but only abnormal results are displayed) Labs Reviewed  HIV ANTIBODY (ROUTINE TESTING W REFLEX)  RPR  CYTOLOGY, (ORAL, ANAL, URETHRAL) ANCILLARY ONLY    EKG   Radiology No results found.  Procedures Procedures (including critical care time)  Medications Ordered in UC Medications - No data to display  Initial Impression / Assessment and Plan / UC Course  I have reviewed  the triage vital signs and the nursing notes.  Pertinent labs & imaging results that were available during my care of the patient were reviewed by me and considered in my medical decision making (see chart for details).     Patient is overall well-appearing.  Vitals are stable.  GU exam declined.  Patient states that rash in his groin appears the same as previous does not want this examined today.  Patient perform self swab for STD.  HIV and RPR ordered.  Prescribed Diflucan  per patient request for tinea cruris.  Prescribed ketoconazole  shampoo and nystatin  powder for additional relief of this.  Given dermatology follow-up.  Clinical staff set patient up with primary care appointment prior to discharge.  Discussed follow-up and return precautions. Final Clinical Impressions(s) / UC Diagnoses   Final diagnoses:  Screening for STD (sexually transmitted disease)  Tinea cruris     Discharge Instructions      You can take 1 tablet of Diflucan  today and another tablet in 3 days if your symptoms continue. I recommend applying nystatin  powder 3 times daily to help with prevention of this. I have represcribed the ketoconazole  shampoo as well.  You can use 2 times weekly for this. Follow-up with the primary care provider that you are established with today.  I have also attached a dermatologist that you can follow-up with for further evaluation of this. Your swab and blood work results will return over the next few days and someone will call if results are positive and require any treatment.   ED Prescriptions     Medication Sig Dispense Auth. Provider   ketoconazole  (NIZORAL ) 2 % shampoo Apply 1 Application topically 2 (two) times a week. 120 mL Johnie Flaming A, NP   fluconazole  (DIFLUCAN ) 150 MG tablet Take one tablet today and one tablet in 3 days if symptoms persist. 2 tablet Johnie Flaming A, NP   nystatin  (MYCOSTATIN /NYSTOP ) powder Apply 1 Application topically 3 (three) times daily.  15 g Johnie Flaming A, NP      PDMP not reviewed this encounter.   Johnie Flaming A, NP 09/21/23 1620

## 2023-09-21 NOTE — ED Triage Notes (Signed)
 Pt requesting STD testing with blood work. States needs a PCP. Denies sx's. States needs refill on diflucan .

## 2023-09-21 NOTE — Discharge Instructions (Addendum)
 You can take 1 tablet of Diflucan  today and another tablet in 3 days if your symptoms continue. I recommend applying nystatin  powder 3 times daily to help with prevention of this. I have represcribed the ketoconazole  shampoo as well.  You can use 2 times weekly for this. Follow-up with the primary care provider that you are established with today.  I have also attached a dermatologist that you can follow-up with for further evaluation of this. Your swab and blood work results will return over the next few days and someone will call if results are positive and require any treatment.

## 2023-09-22 LAB — CYTOLOGY, (ORAL, ANAL, URETHRAL) ANCILLARY ONLY
Chlamydia: NEGATIVE
Comment: NEGATIVE
Comment: NEGATIVE
Comment: NORMAL
Neisseria Gonorrhea: NEGATIVE
Trichomonas: NEGATIVE

## 2023-09-22 LAB — RPR: RPR Ser Ql: NONREACTIVE

## 2023-10-06 ENCOUNTER — Encounter: Payer: Self-pay | Admitting: General Practice

## 2023-10-12 ENCOUNTER — Ambulatory Visit (INDEPENDENT_AMBULATORY_CARE_PROVIDER_SITE_OTHER)

## 2023-10-12 VITALS — BP 126/87 | HR 71 | Temp 98.1°F | Resp 16 | Ht 73.0 in | Wt 332.0 lb

## 2023-10-12 DIAGNOSIS — Z1159 Encounter for screening for other viral diseases: Secondary | ICD-10-CM

## 2023-10-12 DIAGNOSIS — Z0000 Encounter for general adult medical examination without abnormal findings: Secondary | ICD-10-CM | POA: Diagnosis not present

## 2023-10-12 DIAGNOSIS — R5383 Other fatigue: Secondary | ICD-10-CM

## 2023-10-12 DIAGNOSIS — F3341 Major depressive disorder, recurrent, in partial remission: Secondary | ICD-10-CM | POA: Insufficient documentation

## 2023-10-12 DIAGNOSIS — Z131 Encounter for screening for diabetes mellitus: Secondary | ICD-10-CM

## 2023-10-12 DIAGNOSIS — Z6841 Body Mass Index (BMI) 40.0 and over, adult: Secondary | ICD-10-CM | POA: Diagnosis not present

## 2023-10-12 DIAGNOSIS — Z7689 Persons encountering health services in other specified circumstances: Secondary | ICD-10-CM

## 2023-10-12 DIAGNOSIS — L989 Disorder of the skin and subcutaneous tissue, unspecified: Secondary | ICD-10-CM

## 2023-10-12 LAB — POCT GLYCOSYLATED HEMOGLOBIN (HGB A1C): Hemoglobin A1C: 5.2 % (ref 4.0–5.6)

## 2023-10-12 MED ORDER — PHENTERMINE HCL 15 MG PO CAPS: 15.0000 mg | ORAL_CAPSULE | ORAL | 0 refills | Status: DC

## 2023-10-12 NOTE — Progress Notes (Signed)
 Patient ID: Larry Kline, male    DOB: 1985/04/25  MRN: [id number]  CC: Establish Care (Patient is here to established care with provider./~health hx address/~care gaps address//Patient request some referral for weight loss/Dermatology/)   Subjective: Larry Kline is a 38 y.o. male with no pertinent past medical history who presents to clinic to establish care. Reports history of depression symptoms. Reports always feeling down for many years, he often sleeps for more than 12 hours a day. These symptoms last for more than a week at times and sometimes longer. Has not tried talk therapy or any medication is the past. Has been unable to maintain a job due to missing work when symptoms worsen. Reports little desire to go to work and reports he almost did not attend today's appointment due to feeling down.   Pt reports being interested in weight loss, has tried dieting and exercise which includes weight training for over 6 months and has not had any results. Has most difficulty controlling cravings.   Patient reports pimples on his buttocks that he pops, but they return. They are bothersome due to them turning a dark color. Has not tried any medication or ointment. Bumps have been present on and off for many years.  Patient Active Problem List   Diagnosis Date Noted   Recurrent major depressive disorder, in partial remission (HCC) 10/12/2023   TOBACCO USER 03/11/2009   Morbid obesity (HCC) 05/06/2006     Current Outpatient Medications on File Prior to Visit  Medication Sig Dispense Refill   fluconazole  (DIFLUCAN ) 150 MG tablet Take one tablet today and one tablet in 3 days if symptoms persist. (Patient not taking: Reported on 10/12/2023) 2 tablet 0   ketoconazole  (NIZORAL ) 2 % shampoo Apply 1 Application topically 2 (two) times a week. (Patient not taking: Reported on 10/12/2023) 120 mL 0   nystatin  (MYCOSTATIN /NYSTOP ) powder Apply 1 Application topically 3 (three) times daily. (Patient not  taking: Reported on 10/12/2023) 15 g 0   No current facility-administered medications on file prior to visit.    Allergies  Allergen Reactions   Penicillins     Social History   Socioeconomic History   Marital status: Single    Spouse name: Not on file   Number of children: Not on file   Years of education: Not on file   Highest education level: Not on file  Occupational History   Not on file  Tobacco Use   Smoking status: Former    Current packs/day: 0.33    Types: Cigarettes   Smokeless tobacco: Never  Vaping Use   Vaping status: Former  Substance and Sexual Activity   Alcohol use: Not Currently   Drug use: Never   Sexual activity: Yes    Comment: receives anal intercourse only  Other Topics Concern   Not on file  Social History Narrative   Not on file   Social Drivers of Health   Financial Resource Strain: Not on file  Food Insecurity: Not on file  Transportation Needs: Not on file  Physical Activity: Not on file  Stress: Not on file  Social Connections: Not on file  Intimate Partner Violence: Not on file    Family History  Problem Relation Age of Onset   Diabetes Mother    Hyperlipidemia Mother    Hypertension Mother    Breast cancer Maternal Grandmother     History reviewed. No pertinent surgical history.  ROS: Review of Systems Negative except as stated above  PHYSICAL  EXAM: BP 126/87   Pulse 71   Temp 98.1 F (36.7 C) (Oral)   Resp 16   Ht 6' 1 (1.854 m)   Wt (!) 332 lb (150.6 kg)   SpO2 94%   BMI 43.80 kg/m   Physical Exam  General: well-appearing, no acute distress Cardiovascular: regular heart rate and rhythm, normal S1/S2, no murmurs, gallops, or rubs, peripheral pulses 2+ bilaterally Skin: surface of buttocks bilaterally small spots of hyperpigmentation. Few papules noted. No vesicles, swelling or erythema.   Chest: no skeletal deformity, lungs clear to auscultation bilaterally, equal breath sounds bilaterally Abdomen: soft,  non-distended, non-tender to palpation, no hepatomegaly, no splenomegaly, normoactive bowel sounds Musculoskeletal: normal gait  Extremities: no peripheral edema       Latest Ref Rng & Units 10/02/2021   12:09 PM 03/04/2021    7:09 AM 03/03/2021    8:01 PM  CMP  Glucose 70 - 99 mg/dL 94  834  852   BUN 6 - 20 mg/dL 17  8  10    Creatinine 0.61 - 1.24 mg/dL 8.95  8.59  8.74   Sodium 135 - 145 mmol/L 137  129  133   Potassium 3.5 - 5.1 mmol/L 4.4  3.8  4.2   Chloride 98 - 111 mmol/L 107  92  97   CO2 22 - 32 mmol/L 23  25  25    Calcium 8.9 - 10.3 mg/dL 8.9  8.4  8.7   Total Protein 6.5 - 8.1 g/dL 7.0  7.1  8.1   Total Bilirubin 0.3 - 1.2 mg/dL 0.3  0.7  1.0   Alkaline Phos 38 - 126 U/L 43  60  61   AST 15 - 41 U/L 16  37  35   ALT 0 - 44 U/L 19  52  48    Lipid Panel  No results found for: CHOL, TRIG, HDL, CHOLHDL, VLDL, LDLCALC, LDLDIRECT  CBC    Component Value Date/Time   WBC 10.8 (H) 03/04/2021 0709   RBC 5.24 03/04/2021 0709   HGB 14.8 03/04/2021 0709   HCT 43.4 03/04/2021 0709   PLT 163 03/04/2021 0709   MCV 82.8 03/04/2021 0709   MCH 28.2 03/04/2021 0709   MCHC 34.1 03/04/2021 0709   RDW 14.8 03/04/2021 0709   LYMPHSABS 1.0 03/04/2021 0709   MONOABS 1.2 (H) 03/04/2021 0709   EOSABS 0.0 03/04/2021 0709   BASOSABS 0.0 03/04/2021 0709    ASSESSMENT AND PLAN:  1. Encounter to establish care with new provider (Primary) Routine labs to screen for heme disorders, cardiovascular and metabolic disorders. - CBC - Comprehensive metabolic panel with GFR - Lipid panel  2. Morbid obesity (HCC) - Pt has tried diet and exercise for the past 6 months for weight loss but has been unsuccessful. Will order Phentermine  to help reduce cravings. Pt educated on possible side effects of medication. Pt counseled on expected weight loss of 3-5 pounds in a month with this medication when taken alongside a healthy diet and exercise routine.  - The patient will adhere to  a reduced calorie diet of 1800 calories per day. The patient will continue lifestyle modifications including healthy diet and 150 minutes of moderate intensity exercise per week.   - Start phentermine  15 MG capsule; Take 1 capsule (15 mg total) by mouth every morning.  Dispense: 30 capsule; Refill: 0 - Comprehensive metabolic panel with GFR - Lipid panel  3. Recurrent major depressive disorder, in partial remission (HCC) - Given patient history  of recurrent episodes of depressive symptoms for over 2 years, Ambulatory referral to Psychiatry placed.  - Counseled on initial management with and benefits of Cognitive Behavioral Therapy   4. Low energy - Screening for anemia - CBC  5. Skin abnormalities - Ambulatory referral to Dermatology placed to further assess and manage pustules on buttocks.   6. Encounter for hepatitis C screening test for low risk patient - Hepatitis C antibody  7. Diabetes mellitus screening - POCT (Hb A1C) is 5.2 which is in the normal range. Results were discussed with patient.    Patient was given the opportunity to ask questions.  Patient verbalized understanding of the plan and was able to repeat key elements of the plan.    Orders Placed This Encounter  Procedures   CBC   Comprehensive metabolic panel with GFR   Lipid panel   Hepatitis C antibody   Ambulatory referral to Psychiatry   Ambulatory referral to Dermatology   POCT glycosylated hemoglobin (Hb A1C)     Requested Prescriptions   Signed Prescriptions Disp Refills   phentermine  15 MG capsule 30 capsule 0    Sig: Take 1 capsule (15 mg total) by mouth every morning.    Return in about 1 month (around 11/12/2023) for physical.  Sula Leavy Rode, PA-C

## 2023-10-13 ENCOUNTER — Ambulatory Visit: Payer: Self-pay

## 2023-10-13 LAB — COMPREHENSIVE METABOLIC PANEL WITH GFR
ALT: 27 IU/L (ref 0–44)
AST: 18 IU/L (ref 0–40)
Albumin: 4.4 g/dL (ref 4.1–5.1)
Alkaline Phosphatase: 54 IU/L (ref 44–121)
BUN/Creatinine Ratio: 14 (ref 9–20)
BUN: 14 mg/dL (ref 6–20)
Bilirubin Total: 0.4 mg/dL (ref 0.0–1.2)
CO2: 23 mmol/L (ref 20–29)
Calcium: 9.1 mg/dL (ref 8.7–10.2)
Chloride: 104 mmol/L (ref 96–106)
Creatinine, Ser: 1.01 mg/dL (ref 0.76–1.27)
Globulin, Total: 2.9 g/dL (ref 1.5–4.5)
Glucose: 94 mg/dL (ref 70–99)
Potassium: 4.5 mmol/L (ref 3.5–5.2)
Sodium: 141 mmol/L (ref 134–144)
Total Protein: 7.3 g/dL (ref 6.0–8.5)
eGFR: 99 mL/min/1.73 (ref >59)

## 2023-10-13 LAB — CBC
Hematocrit: 48.2 % (ref 37.5–51.0)
Hemoglobin: 15.6 g/dL (ref 13.0–17.7)
MCH: 28.3 pg (ref 26.6–33.0)
MCHC: 32.4 g/dL (ref 31.5–35.7)
MCV: 88 fL (ref 79–97)
Platelets: 263 x10E3/uL (ref 150–450)
RBC: 5.51 x10E6/uL (ref 4.14–5.80)
RDW: 14.9 % (ref 11.6–15.4)
WBC: 5.7 x10E3/uL (ref 3.4–10.8)

## 2023-10-13 LAB — LIPID PANEL
Chol/HDL Ratio: 5.3 ratio — ABNORMAL HIGH (ref 0.0–5.0)
Cholesterol, Total: 181 mg/dL (ref 100–199)
HDL: 34 mg/dL — ABNORMAL LOW (ref >39)
LDL Chol Calc (NIH): 118 mg/dL — ABNORMAL HIGH (ref 0–99)
Triglycerides: 160 mg/dL — ABNORMAL HIGH (ref 0–149)
VLDL Cholesterol Cal: 29 mg/dL (ref 5–40)

## 2023-10-13 LAB — HEPATITIS C ANTIBODY: Hep C Virus Ab: NONREACTIVE

## 2023-10-13 NOTE — Progress Notes (Signed)
 CBC which looks at your blood cell count is Normal. Comprehensive Metabolic panel which screens for metabolic disorders is Normal. Lipid Panel which checks your cholesterol shows slightly elevated LDL (bad cholesterol) and Triglycerides. I recommend continuing your plan for weight loss and increasing exercise and those numbers should go down. We can recheck them in the future.  Take Care!

## 2023-11-12 ENCOUNTER — Ambulatory Visit (INDEPENDENT_AMBULATORY_CARE_PROVIDER_SITE_OTHER)

## 2023-11-12 ENCOUNTER — Telehealth: Payer: Self-pay

## 2023-11-12 VITALS — BP 139/84 | HR 66 | Temp 97.8°F | Resp 16 | Ht 73.0 in | Wt 325.0 lb

## 2023-11-12 DIAGNOSIS — Z0000 Encounter for general adult medical examination without abnormal findings: Secondary | ICD-10-CM

## 2023-11-12 MED ORDER — PHENTERMINE HCL 30 MG PO CAPS: 30.0000 mg | ORAL_CAPSULE | ORAL | 0 refills | Status: DC

## 2023-11-12 NOTE — Telephone Encounter (Signed)
 Copied from CRM #[id number]. Topic: Clinical - Prescription Issue >> Nov 12, 2023  5:55 PM Kevelyn M wrote: Reason for CRM: Patient calling in because his pharmacy informed him that he needs a new prescription for phentermine  30 MG capsule because of the MPI #.  Call back #[phone]

## 2023-11-12 NOTE — Progress Notes (Signed)
 Patient ID: Larry Kline, male    DOB: 01/31/1986  MRN: [id number]  CC: Annual Exam (-Patient is here to have annually  complete physical examination /-Care gap address/-labs taken )   Subjective: Larry Kline is a 38 y.o. male with past medical history of obesity who presents to clinic for yearly physical exam.  Patient reports that he has been doing really well on the weight loss medication.  He is taking it as prescribed and feels that it is helping him control his cravings for food.    Patient Active Problem List   Diagnosis Date Noted   Recurrent major depressive disorder, in partial remission (HCC) 10/12/2023   TOBACCO USER 03/11/2009   Morbid obesity (HCC) 05/06/2006      Allergies  Allergen Reactions   Penicillins     Social History   Socioeconomic History   Marital status: Single    Spouse name: Not on file   Number of children: Not on file   Years of education: Not on file   Highest education level: Not on file  Occupational History   Not on file  Tobacco Use   Smoking status: Former    Current packs/day: 0.33    Types: Cigarettes   Smokeless tobacco: Never  Vaping Use   Vaping status: Former  Substance and Sexual Activity   Alcohol use: Not Currently   Drug use: Never   Sexual activity: Yes    Comment: receives anal intercourse only  Other Topics Concern   Not on file  Social History Narrative   Not on file   Social Drivers of Health   Financial Resource Strain: Not on file  Food Insecurity: Not on file  Transportation Needs: Not on file  Physical Activity: Not on file  Stress: Not on file  Social Connections: Not on file  Intimate Partner Violence: Not on file    Family History  Problem Relation Age of Onset   Diabetes Mother    Hyperlipidemia Mother    Hypertension Mother    Breast cancer Maternal Grandmother     History reviewed. No pertinent surgical history.  ROS: Review of Systems Negative except as stated  above  PHYSICAL EXAM: BP 139/84   Pulse 66   Temp 97.8 F (36.6 C) (Oral)   Resp 16   Ht 6' 1 (1.854 m)   Wt (!) 325 lb (147.4 kg)   SpO2 96%   BMI 42.88 kg/m   Physical Exam  General: well-appearing, no acute distress Skin: no jaundice, rashes, or lesions Head: normocephalic, no lesions, no abnormal hair distribution or loss Eyes: anicteric sclera, pupils equally round and reactive to light and accommodation, extraocular movements intact, appropriate visual acuity Ears: no external lesions, tympanic membrane translucent  Nose: no septal deviation, turbinates clear Throat: trachea midline, no thyromegaly, moist mucus membranes Cardiovascular: regular heart rate and rhythm, normal S1/S2, no murmurs, gallops, or rubs, peripheral pulses 2+ bilaterally Chest: no skeletal deformity, lungs clear to auscultation bilaterally, equal breath sounds bilaterally Abdomen: soft, non-distended, non-tender to palpation, no hepatomegaly, no splenomegaly, normoactive bowel sounds GU: deferred Musculoskeletal: strength of upper and lower extremities equal bilaterally, 5/5, normal gait Extremities: no peripheral edema, nails intact Neurologic: cranial nerves II-XII intact      Latest Ref Rng & Units 10/12/2023    3:54 PM 10/02/2021   12:09 PM 03/04/2021    7:09 AM  CMP  Glucose 70 - 99 mg/dL 94  94  834   BUN 6 -  20 mg/dL 14  17  8    Creatinine 0.76 - 1.27 mg/dL 8.98  8.95  8.59   Sodium 134 - 144 mmol/L 141  137  129   Potassium 3.5 - 5.2 mmol/L 4.5  4.4  3.8   Chloride 96 - 106 mmol/L 104  107  92   CO2 20 - 29 mmol/L 23  23  25    Calcium 8.7 - 10.2 mg/dL 9.1  8.9  8.4   Total Protein 6.0 - 8.5 g/dL 7.3  7.0  7.1   Total Bilirubin 0.0 - 1.2 mg/dL 0.4  0.3  0.7   Alkaline Phos 44 - 121 IU/L 54  43  60   AST 0 - 40 IU/L 18  16  37   ALT 0 - 44 IU/L 27  19  52    Lipid Panel     Component Value Date/Time   CHOL 181 10/12/2023 1554   TRIG 160 (H) 10/12/2023 1554   HDL 34 (L)  10/12/2023 1554   CHOLHDL 5.3 (H) 10/12/2023 1554   LDLCALC 118 (H) 10/12/2023 1554    CBC    Component Value Date/Time   WBC 5.7 10/12/2023 1554   WBC 10.8 (H) 03/04/2021 0709   RBC 5.51 10/12/2023 1554   RBC 5.24 03/04/2021 0709   HGB 15.6 10/12/2023 1554   HCT 48.2 10/12/2023 1554   PLT 263 10/12/2023 1554   MCV 88 10/12/2023 1554   MCH 28.3 10/12/2023 1554   MCH 28.2 03/04/2021 0709   MCHC 32.4 10/12/2023 1554   MCHC 34.1 03/04/2021 0709   RDW 14.9 10/12/2023 1554   LYMPHSABS 1.0 03/04/2021 0709   MONOABS 1.2 (H) 03/04/2021 0709   EOSABS 0.0 03/04/2021 0709   BASOSABS 0.0 03/04/2021 0709    ASSESSMENT AND PLAN:  1. Encounter for annual wellness visit (Primary) - Complete physical exam performed today, no abnormal findings.   2. Morbid obesity (HCC) - Pt is doing well with weight loss. Was 325 lbs today, previously 332lbs one month ago. Pt congratulated on 7 lbs weight loss in one month. Will continue to monitor. - Patient is adhering to a reduced calorie diet of 1800 calories per day. The patient will continue lifestyle modifications including healthy diet and 150 minutes of moderate intensity exercise per week.   - phentermine  30 MG capsule; Take 1 capsule (30 mg total) by mouth every morning.  Dispense: 30 capsule; Refill: 0 - Reviewed side effects of phentermine . Pt expressed understanding.    Patient was given the opportunity to ask questions.  Patient verbalized understanding of the plan and was able to repeat key elements of the plan.    Requested Prescriptions   Signed Prescriptions Disp Refills   phentermine  30 MG capsule 30 capsule 0    Sig: Take 1 capsule (30 mg total) by mouth every morning.    Return in about 1 month (around 12/12/2023) for follow-up.  Larry Leavy Rode, PA-C

## 2023-11-15 ENCOUNTER — Other Ambulatory Visit: Payer: Self-pay

## 2023-11-15 MED ORDER — PHENTERMINE HCL 30 MG PO CAPS: 30.0000 mg | ORAL_CAPSULE | ORAL | 0 refills | Status: AC

## 2023-12-07 ENCOUNTER — Ambulatory Visit (HOSPITAL_COMMUNITY): Payer: Self-pay | Admitting: Family

## 2023-12-16 ENCOUNTER — Telehealth: Payer: Self-pay

## 2023-12-16 ENCOUNTER — Ambulatory Visit

## 2023-12-16 NOTE — Telephone Encounter (Signed)
 Called pt to reschedule missed OV; could not reach or leave vm

## 2024-03-21 ENCOUNTER — Ambulatory Visit

## 2024-04-14 ENCOUNTER — Other Ambulatory Visit: Payer: Self-pay

## 2024-04-14 NOTE — Telephone Encounter (Signed)
 Copied from CRM #[id number]. Topic: Clinical - Medication Refill >> Apr 14, 2024 11:53 AM Lonell PEDLAR wrote: Medication:  phentermine  30 MG capsule  fluconazole  (DIFLUCAN ) 150 MG tablet   Has the patient contacted their pharmacy? No, patient was advised to speak with PCP for refills (Agent: If no, request that the patient contact the pharmacy for the refill. If patient does not wish to contact the pharmacy document the reason why and proceed with request.) (Agent: If yes, when and what did the pharmacy advise?)  This is the patient's preferred pharmacy:  [facility] [phone] [address], [place] - [address] [place] [address] Phone: [phone] Fax: [phone]  Is this the correct pharmacy for this prescription? Yes If no, delete pharmacy and type the correct one.   Has the prescription been filled recently? Yes  Is the patient out of the medication? Yes  Has the patient been seen for an appointment in the last year OR does the patient have an upcoming appointment? Yes  Can we respond through MyChart? Yes  Agent: Please be advised that Rx refills may take up to 3 business days. We ask that you follow-up with your pharmacy.

## 2024-05-16 ENCOUNTER — Ambulatory Visit: Payer: Self-pay
